# Patient Record
Sex: Male | Born: 1985 | Race: Black or African American | Hispanic: No | Marital: Single | Smoking: Never smoker
Health system: Southern US, Community
[De-identification: ages and names within clinical notes are randomized; demographics above are authoritative.]

## PROBLEM LIST (undated history)

## (undated) DIAGNOSIS — G8929 Other chronic pain: Secondary | ICD-10-CM

## (undated) DIAGNOSIS — M1990 Unspecified osteoarthritis, unspecified site: Secondary | ICD-10-CM

## (undated) HISTORY — PX: ARTHROSCOPIC REPAIR ACL: SUR80

## (undated) HISTORY — PX: MENISCUS REPAIR: SHX5179

## (undated) HISTORY — DX: Other chronic pain: G89.29

---
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
## 2018-10-21 ENCOUNTER — Ambulatory Visit (HOSPITAL_COMMUNITY)
Admission: EM | Admit: 2018-10-21 | Discharge: 2018-10-21 | Disposition: A | Discharge status: Home / Self Care | Payer: BC Managed Care – PPO | Attending: Emergency Medicine | Admitting: Emergency Medicine

## 2018-10-21 ENCOUNTER — Other Ambulatory Visit: Payer: Self-pay

## 2018-10-21 ENCOUNTER — Encounter (HOSPITAL_COMMUNITY): Payer: Self-pay

## 2018-10-21 DIAGNOSIS — B356 Tinea cruris: Secondary | ICD-10-CM

## 2018-10-21 LAB — POCT URINALYSIS DIP (DEVICE)
Bilirubin Urine: NEGATIVE
Glucose, UA: NEGATIVE mg/dL
Ketones, ur: NEGATIVE mg/dL
Leukocytes,Ua: NEGATIVE
Nitrite: NEGATIVE
Protein, ur: NEGATIVE mg/dL
Specific Gravity, Urine: >=1.03 (ref 1.005–1.030)
Urobilinogen, UA: 0.2 mg/dL (ref 0.0–1.0)
pH: 6 (ref 5.0–8.0)

## 2018-10-21 MED ORDER — CLOTRIMAZOLE 1 % EX CREA: TOPICAL_CREAM | CUTANEOUS | 0 refills | Status: DC

## 2018-10-21 NOTE — Discharge Instructions (Signed)
Use the anti-fungal cream twice a day.    Return here or follow-up with your primary care provider if your rash is not improving.

## 2018-10-21 NOTE — ED Provider Notes (Signed)
Brandon Hickman    CSN: [id number] Arrival date & time: 10/21/18  1000      History   Chief Complaint Chief Complaint  Patient presents with  . Exposure to STD    HPI Brandon Hickman is a 33 y.o. male.   Patient presents with a "ringworm" rash on his right scrotum and groin.  He states it itches.  He denies penile discharge, testicular pain, abdominal pain, dysuria, back pain, or other symptoms.  The history is provided by the patient.    History reviewed. No pertinent past medical history.  There are no active problems to display for this patient.   History reviewed. No pertinent surgical history.     Home Medications    Prior to Admission medications   Medication Sig Start Date End Date Taking? Authorizing Provider  clotrimazole (LOTRIMIN) 1 % cream Apply to affected area 2 times daily 10/21/18   Sharion Balloon, NP    Family History History reviewed. No pertinent family history.  Social History Social History   Tobacco Use  . Smoking status: Never Smoker  . Smokeless tobacco: Never Used  Substance Use Topics  . Alcohol use: Never    Frequency: Never  . Drug use: Yes    Types: Marijuana     Allergies   Patient has no known allergies.   Review of Systems Review of Systems  Constitutional: Negative for chills and fever.  HENT: Negative for ear pain and sore throat.   Eyes: Negative for pain and visual disturbance.  Respiratory: Negative for cough and shortness of breath.   Cardiovascular: Negative for chest pain and palpitations.  Gastrointestinal: Negative for abdominal pain and vomiting.  Genitourinary: Negative for discharge, dysuria, flank pain, hematuria, penile pain and testicular pain.  Musculoskeletal: Negative for arthralgias and back pain.  Skin: Positive for rash. Negative for color change.  Neurological: Negative for seizures and syncope.  All other systems reviewed and are negative.    Physical Exam Triage Vital Signs  ED Triage Vitals  Enc Vitals Group     BP      Pulse      Resp      Temp      Temp src      SpO2      Weight      Height      Head Circumference      Peak Flow      Pain Score      Pain Loc      Pain Edu?      Excl. in [place]?    No data found.  Updated Vital Signs BP 132/90 (BP Location: Right Arm)   Pulse 71   Temp 99 F (37.2 C) (Oral)   Resp 16   SpO2 97%   Visual Acuity Right Eye Distance:   Left Eye Distance:   Bilateral Distance:    Right Eye Near:   Left Eye Near:    Bilateral Near:     Physical Exam Vitals signs and nursing note reviewed.  Constitutional:      Appearance: He is well-developed.  HENT:     Head: Normocephalic and atraumatic.  Eyes:     Conjunctiva/sclera: Conjunctivae normal.  Neck:     Musculoskeletal: Neck supple.  Cardiovascular:     Rate and Rhythm: Normal rate and regular rhythm.     Heart sounds: No murmur.  Pulmonary:     Effort: Pulmonary effort is normal. No respiratory distress.  Breath sounds: Normal breath sounds.  Abdominal:     Palpations: Abdomen is soft.     Tenderness: There is no abdominal tenderness. There is no right CVA tenderness, left CVA tenderness, guarding or rebound.  Genitourinary:    Penis: Normal.      Scrotum/Testes: Normal.  Skin:    General: Skin is warm and dry.     Findings: Rash present.     Comments: Candidal rash on right scrotum/groin. No drainage.  Neurological:     General: No focal deficit present.     Mental Status: He is alert and oriented to person, place, and time.      UC Treatments / Results  Labs (all labs ordered are listed, but only abnormal results are displayed) Labs Reviewed  POCT URINALYSIS DIP (DEVICE) - Abnormal; Notable for the following components:      Result Value   Hgb urine dipstick MODERATE (*)    All other components within normal limits    EKG   Radiology No results found.  Procedures Procedures (including critical care time)  Medications  Ordered in UC Medications - No data to display  Initial Impression / Assessment and Plan / UC Course  I have reviewed the triage vital signs and the nursing notes.  Pertinent labs & imaging results that were available during my care of the patient were reviewed by me and considered in my medical decision making (see chart for details).    Tinea cruris.  Treating with clotrimazole cream.  Instructed patient to return here or follow-up with his PCP if his symptoms or not improving.  Patient agrees to plan of care.     Final Clinical Impressions(s) / UC Diagnoses   Final diagnoses:  Tinea cruris     Discharge Instructions     Use the anti-fungal cream twice a day.    Return here or follow-up with your primary care provider if your rash is not improving.          ED Prescriptions    Medication Sig Dispense Auth. Provider   clotrimazole (LOTRIMIN) 1 % cream Apply to affected area 2 times daily 15 g Mickie Bail, NP     PDMP not reviewed this encounter.   Mickie Bail, NP 10/21/18 1029

## 2018-10-21 NOTE — ED Notes (Signed)
Drinking water. Patient is unable to void at this time

## 2018-10-21 NOTE — ED Triage Notes (Signed)
Pt reports itchy in his testicles x 2 weeks.

## 2020-08-14 ENCOUNTER — Encounter (HOSPITAL_COMMUNITY): Payer: Self-pay | Admitting: Emergency Medicine

## 2020-08-14 ENCOUNTER — Emergency Department (HOSPITAL_COMMUNITY)
Admission: EM | Admit: 2020-08-14 | Discharge: 2020-08-15 | Disposition: A | Discharge status: Home / Self Care | Payer: BC Managed Care – PPO | Attending: Emergency Medicine | Admitting: Emergency Medicine

## 2020-08-14 ENCOUNTER — Emergency Department (HOSPITAL_COMMUNITY): Payer: BC Managed Care – PPO

## 2020-08-14 ENCOUNTER — Other Ambulatory Visit: Payer: Self-pay

## 2020-08-14 DIAGNOSIS — R109 Unspecified abdominal pain: Secondary | ICD-10-CM | POA: Insufficient documentation

## 2020-08-14 DIAGNOSIS — M545 Low back pain, unspecified: Secondary | ICD-10-CM | POA: Diagnosis not present

## 2020-08-14 LAB — CBC WITH DIFFERENTIAL/PLATELET
Abs Immature Granulocytes: 0.05 10*3/uL (ref 0.00–0.07)
Basophils Absolute: 0 10*3/uL (ref 0.0–0.1)
Basophils Relative: 0 %
Eosinophils Absolute: 0.2 10*3/uL (ref 0.0–0.5)
Eosinophils Relative: 2 %
HCT: 48 % (ref 39.0–52.0)
Hemoglobin: 16 g/dL (ref 13.0–17.0)
Immature Granulocytes: 0 %
Lymphocytes Relative: 26 %
Lymphs Abs: 3.1 10*3/uL (ref 0.7–4.0)
MCH: 28.3 pg (ref 26.0–34.0)
MCHC: 33.3 g/dL (ref 30.0–36.0)
MCV: 84.8 fL (ref 80.0–100.0)
Monocytes Absolute: 1 10*3/uL (ref 0.1–1.0)
Monocytes Relative: 8 %
Neutro Abs: 7.4 10*3/uL (ref 1.7–7.7)
Neutrophils Relative %: 64 %
Platelets: 215 10*3/uL (ref 150–400)
RBC: 5.66 MIL/uL (ref 4.22–5.81)
RDW: 13.3 % (ref 11.5–15.5)
WBC: 11.8 10*3/uL — ABNORMAL HIGH (ref 4.0–10.5)
nRBC: 0 % (ref 0.0–0.2)

## 2020-08-14 LAB — BASIC METABOLIC PANEL
Anion gap: 11 (ref 5–15)
BUN: 18 mg/dL (ref 6–20)
CO2: 23 mmol/L (ref 22–32)
Calcium: 9.5 mg/dL (ref 8.9–10.3)
Chloride: 105 mmol/L (ref 98–111)
Creatinine, Ser: 1.08 mg/dL (ref 0.61–1.24)
GFR, Estimated: >60 mL/min (ref >60)
Glucose, Bld: 103 mg/dL — ABNORMAL HIGH (ref 70–99)
Potassium: 4 mmol/L (ref 3.5–5.1)
Sodium: 139 mmol/L (ref 135–145)

## 2020-08-14 NOTE — ED Provider Notes (Signed)
Emergency Medicine Provider Triage Evaluation Note  Brandon Hickman , a 35 y.o. male  was evaluated in triage.  Pt complains of bilateral flank pain worse on the left than the right that woke from sleep last night now worsening.  10/10, pain rating on the left leg, worse with movement of the left leg.  History of kidney stones, decreased urine output.  Review of Systems  Positive: Flank pain, low back pain, left leg pain, decreased urine output Negative: Fevers, chills, nausea, vomiting  Physical Exam  BP 131/81 (BP Location: Right Arm)   Pulse 74   Temp 98.4 F (36.9 C) (Oral)   Resp 20   SpO2 100%  Gen:   Awake, obviously uncomfortable Resp:  Tachypneic MSK:   Moves extremities without difficulty, low back pain with movement of the left leg Other:  RRR no M/R/G.  Lungs CTA B.  Tenderness palpation of the back bilaterally, L>R  Medical Decision Making  Medically screening exam initiated at 9:30 PM.  Appropriate orders placed.  Brandon Hickman was informed that the remainder of the evaluation will be completed by another provider, this initial triage assessment does not replace that evaluation, and the importance of remaining in the ED until their evaluation is complete.  This chart was dictated using voice recognition software, Dragon. Despite the best efforts of this provider to proofread and correct errors, errors may still occur which can change documentation meaning.    Sherrilee Gilles 08/14/20 2132    Dione Booze, MD 08/15/20 [phone]

## 2020-08-14 NOTE — ED Triage Notes (Addendum)
Pt reports bilateral flank pain (worse on left side) which worsens with movement. States he thinks its his kidneys from drinking too much soda. Denies injury and changes in urine. Hx of kidney stones.

## 2020-08-15 LAB — URINALYSIS, ROUTINE W REFLEX MICROSCOPIC
Bacteria, UA: NONE SEEN
Bilirubin Urine: NEGATIVE
Glucose, UA: NEGATIVE mg/dL
Ketones, ur: 20 mg/dL — AB
Leukocytes,Ua: NEGATIVE
Nitrite: NEGATIVE
Protein, ur: NEGATIVE mg/dL
Specific Gravity, Urine: 1.031 — ABNORMAL HIGH (ref 1.005–1.030)
pH: 5 (ref 5.0–8.0)

## 2020-08-15 MED ORDER — IBUPROFEN 800 MG PO TABS
800.0000 mg | ORAL_TABLET | Freq: Once | ORAL | Status: AC
  Administered 2020-08-15: 800 mg via ORAL
  Filled 2020-08-15: qty 1

## 2020-08-15 MED ORDER — ACETAMINOPHEN 325 MG PO TABS
650.0000 mg | ORAL_TABLET | Freq: Once | ORAL | Status: AC
  Administered 2020-08-15: 650 mg via ORAL
  Filled 2020-08-15: qty 2

## 2020-08-15 MED ORDER — CYCLOBENZAPRINE HCL 10 MG PO TABS: 10.0000 mg | ORAL_TABLET | Freq: Three times a day (TID) | ORAL | 0 refills | Status: DC | PRN

## 2020-08-15 MED ORDER — CYCLOBENZAPRINE HCL 10 MG PO TABS
10.0000 mg | ORAL_TABLET | Freq: Once | ORAL | Status: AC
  Administered 2020-08-15: 10 mg via ORAL
  Filled 2020-08-15: qty 1

## 2020-08-15 NOTE — ED Provider Notes (Signed)
[facility]-EMERGENCY DEPT Provider Note   CSN: [id number] Arrival date & time: 08/14/20  2106     History Chief Complaint  Patient presents with   Flank Pain    Brandon Hickman is a 35 y.o. male.  The history is provided by the patient.  Flank Pain He complains of pain in the left lower back which started yesterday.  Pain is nonradiating.  He had taken ibuprofen which gave some slight, temporary relief.  Pain started after he got home from work.  It got worse when he was at work today.  Work does involve moderate amount of lifting.  He denies any weakness, numbness, tingling.  It is worse with movement.  Pain was as severe as 10/10.  At work, he was unable to walk without assistance because of the pain.   History reviewed. No pertinent past medical history.  There are no problems to display for this patient.   History reviewed. No pertinent surgical history.     No family history on file.  Social History   Tobacco Use   Smoking status: Never   Smokeless tobacco: Never  Substance Use Topics   Alcohol use: Never   Drug use: Yes    Types: Marijuana    Home Medications Prior to Admission medications   Medication Sig Start Date End Date Taking? Authorizing Provider  clotrimazole (LOTRIMIN) 1 % cream Apply to affected area 2 times daily 10/21/18   Mickie Bail, NP    Allergies    Patient has no known allergies.  Review of Systems   Review of Systems  Genitourinary:  Positive for flank pain.  All other systems reviewed and are negative.  Physical Exam Updated Vital Signs BP (!) 148/100   Pulse 74   Temp 98.4 F (36.9 C) (Oral)   Resp 20   SpO2 100%   Physical Exam Vitals and nursing note reviewed.  35 year old male, resting comfortably and in no acute distress. Vital signs are significant for elevated blood pressure. Oxygen saturation is 100%, which is normal. Head is normocephalic and atraumatic. PERRLA, EOMI. Oropharynx is  clear. Neck is nontender and supple without adenopathy or JVD. Back is nontender in the midline.  There is mild tenderness in the left paralumbar area with mild to moderate left paralumbar spasm.  Straight leg raise is positive on the left at 5 degrees, crossed straight leg raise is positive on the right at 30 degrees.  There is no CVA tenderness. Lungs are clear without rales, wheezes, or rhonchi. Chest is nontender. Heart has regular rate and rhythm without murmur. Abdomen is soft, flat, nontender without masses or hepatosplenomegaly and peristalsis is normoactive. Extremities have no cyanosis or edema, full range of motion is present. Skin is warm and dry without rash. Neurologic: Mental status is normal, cranial nerves are intact, there are no motor or sensory deficits.  ED Results / Procedures / Treatments   Labs (all labs ordered are listed, but only abnormal results are displayed) Labs Reviewed  BASIC METABOLIC PANEL - Abnormal; Notable for the following components:      Result Value   Glucose, Bld 103 (*)    All other components within normal limits  CBC WITH DIFFERENTIAL/PLATELET - Abnormal; Notable for the following components:   WBC 11.8 (*)    All other components within normal limits  URINALYSIS, ROUTINE W REFLEX MICROSCOPIC    Radiology CT L-SPINE NO CHARGE  Result Date: 08/14/2020 CLINICAL DATA:  Left flank  pain EXAM: CT LUMBAR SPINE WITHOUT CONTRAST TECHNIQUE: Multidetector CT imaging of the lumbar spine was performed without intravenous contrast administration. Multiplanar CT image reconstructions were also generated. COMPARISON:  None. FINDINGS: Segmentation: 5 lumbar type vertebrae. Alignment: Normal. Vertebrae: No acute fracture or focal pathologic process. Paraspinal and other soft tissues: Negative. Disc levels: No spinal canal or neural foraminal stenosis. IMPRESSION: No acute fracture or static subluxation of the lumbar spine. Electronically Signed   By: Deatra Robinson M.D.   On: 08/14/2020 22:21   CT Renal Stone Study  Result Date: 08/14/2020 CLINICAL DATA:  Left flank pain. Concern for kidney stone. EXAM: CT ABDOMEN AND PELVIS WITHOUT CONTRAST TECHNIQUE: Multidetector CT imaging of the abdomen and pelvis was performed following the standard protocol without IV contrast. COMPARISON:  Lumbar spine CT dated 08/14/2020. FINDINGS: Evaluation of this exam is limited in the absence of intravenous contrast. Lower chest: The visualized lung bases are clear. No intra-abdominal free air or free fluid. Hepatobiliary: Fatty liver. No intrahepatic biliary ductal dilatation. The gallbladder is unremarkable. Pancreas: Unremarkable. No pancreatic ductal dilatation or surrounding inflammatory changes. Spleen: Normal in size without focal abnormality. Adrenals/Urinary Tract: The adrenal glands are unremarkable. The kidneys, visualized ureters, and the urinary bladder appear unremarkable. Stomach/Bowel: There is no bowel obstruction or active inflammation. The appendix is normal. Vascular/Lymphatic: The abdominal aorta and IVC are grossly unremarkable on this noncontrast CT. No portal venous gas. There is no adenopathy. Reproductive: The prostate and seminal vesicles are grossly unremarkable. No pelvic mass Other: None Musculoskeletal: There is straightening of normal lumbar lordosis. T12 limbus vertebra. No acute osseous pathology. IMPRESSION: 1. No acute intra-abdominal or pelvic pathology. No hydronephrosis or nephrolithiasis. 2. Fatty liver. Electronically Signed   By: Elgie Collard M.D.   On: 08/14/2020 21:59    Procedures Procedures   Medications Ordered in ED Medications  ibuprofen (ADVIL) tablet 800 mg (800 mg Oral Given 08/15/20 0150)  cyclobenzaprine (FLEXERIL) tablet 10 mg (10 mg Oral Given 08/15/20 0150)  acetaminophen (TYLENOL) tablet 650 mg (650 mg Oral Given 08/15/20 0149)    ED Course  I have reviewed the triage vital signs and the nursing notes.  Pertinent  labs & imaging results that were available during my care of the patient were reviewed by me and considered in my medical decision making (see chart for details).   MDM Rules/Calculators/A&P                         Musculoskeletal left-sided low back pain.  Work-up ordered at triage included normal renal stone protocol CT scan, normal CT of lumbar spine, CBC with slightly elevated WBC with normal differential, normal metabolic panel.  Urinalysis had slight ketonuria with 6-10 RBCs per high-power field.  Old records are reviewed, and he has no relevant past visits.  He will be given dose of ibuprofen, acetaminophen, and cyclobenzaprine and reassessed.  He had moderate relief of pain with above-noted treatment.  He is discharged with instructions to apply ice, continue to use over-the-counter NSAIDs and acetaminophen.  Given prescription for cyclobenzaprine.  Final Clinical Impression(s) / ED Diagnoses Final diagnoses:  Low back pain    Rx / DC Orders ED Discharge Orders          Ordered    cyclobenzaprine (FLEXERIL) 10 MG tablet  3 times daily PRN        08/15/20 0256             Dione Booze, MD  08/15/20 0258  

## 2020-08-15 NOTE — Discharge Instructions (Addendum)
Apply ice for 30 minutes at a time, 4 times a day.  Take ibuprofen or naproxen as needed for pain.  For additional pain relief, add acetaminophen.  Combining acetaminophen with either ibuprofen or naproxen gives you better pain relief than either medication by itself.  Take the muscle relaxer 3 times a day, as needed.

## 2021-11-21 ENCOUNTER — Ambulatory Visit: Admission: EM | Admit: 2021-11-21 | Discharge: 2021-11-21 | Payer: BC Managed Care – PPO

## 2021-11-21 ENCOUNTER — Other Ambulatory Visit: Payer: Self-pay

## 2021-11-21 ENCOUNTER — Emergency Department (HOSPITAL_COMMUNITY): Payer: BC Managed Care – PPO

## 2021-11-21 ENCOUNTER — Encounter (HOSPITAL_COMMUNITY): Payer: Self-pay

## 2021-11-21 ENCOUNTER — Observation Stay (HOSPITAL_COMMUNITY)
Admission: EM | Admit: 2021-11-21 | Discharge: 2021-11-22 | Disposition: A | Discharge status: Home / Self Care | Payer: BC Managed Care – PPO | Attending: Internal Medicine | Admitting: Internal Medicine

## 2021-11-21 DIAGNOSIS — R1084 Generalized abdominal pain: Secondary | ICD-10-CM | POA: Diagnosis not present

## 2021-11-21 DIAGNOSIS — E669 Obesity, unspecified: Secondary | ICD-10-CM | POA: Diagnosis not present

## 2021-11-21 DIAGNOSIS — Z1152 Encounter for screening for COVID-19: Secondary | ICD-10-CM | POA: Insufficient documentation

## 2021-11-21 DIAGNOSIS — R112 Nausea with vomiting, unspecified: Secondary | ICD-10-CM | POA: Insufficient documentation

## 2021-11-21 DIAGNOSIS — Z6832 Body mass index (BMI) 32.0-32.9, adult: Secondary | ICD-10-CM | POA: Insufficient documentation

## 2021-11-21 DIAGNOSIS — R1011 Right upper quadrant pain: Secondary | ICD-10-CM | POA: Diagnosis not present

## 2021-11-21 DIAGNOSIS — R109 Unspecified abdominal pain: Principal | ICD-10-CM | POA: Insufficient documentation

## 2021-11-21 DIAGNOSIS — Z79899 Other long term (current) drug therapy: Secondary | ICD-10-CM | POA: Diagnosis not present

## 2021-11-21 DIAGNOSIS — R6510 Systemic inflammatory response syndrome (SIRS) of non-infectious origin without acute organ dysfunction: Secondary | ICD-10-CM | POA: Insufficient documentation

## 2021-11-21 DIAGNOSIS — T68XXXA Hypothermia, initial encounter: Secondary | ICD-10-CM | POA: Diagnosis not present

## 2021-11-21 DIAGNOSIS — R079 Chest pain, unspecified: Secondary | ICD-10-CM | POA: Diagnosis not present

## 2021-11-21 DIAGNOSIS — E872 Acidosis, unspecified: Secondary | ICD-10-CM | POA: Insufficient documentation

## 2021-11-21 DIAGNOSIS — R0689 Other abnormalities of breathing: Secondary | ICD-10-CM | POA: Diagnosis not present

## 2021-11-21 DIAGNOSIS — F12188 Cannabis abuse with other cannabis-induced disorder: Secondary | ICD-10-CM | POA: Diagnosis not present

## 2021-11-21 DIAGNOSIS — N2889 Other specified disorders of kidney and ureter: Secondary | ICD-10-CM | POA: Diagnosis not present

## 2021-11-21 DIAGNOSIS — K760 Fatty (change of) liver, not elsewhere classified: Secondary | ICD-10-CM | POA: Diagnosis not present

## 2021-11-21 DIAGNOSIS — R1013 Epigastric pain: Secondary | ICD-10-CM | POA: Diagnosis not present

## 2021-11-21 DIAGNOSIS — R9431 Abnormal electrocardiogram [ECG] [EKG]: Secondary | ICD-10-CM | POA: Diagnosis not present

## 2021-11-21 DIAGNOSIS — I959 Hypotension, unspecified: Secondary | ICD-10-CM | POA: Diagnosis not present

## 2021-11-21 LAB — COMPREHENSIVE METABOLIC PANEL
ALT: 48 U/L — ABNORMAL HIGH (ref 0–44)
AST: 31 U/L (ref 15–41)
Albumin: 4.6 g/dL (ref 3.5–5.0)
Alkaline Phosphatase: 52 U/L (ref 38–126)
Anion gap: 15 (ref 5–15)
BUN: 10 mg/dL (ref 6–20)
CO2: 18 mmol/L — ABNORMAL LOW (ref 22–32)
Calcium: 9.6 mg/dL (ref 8.9–10.3)
Chloride: 108 mmol/L (ref 98–111)
Creatinine, Ser: 0.96 mg/dL (ref 0.61–1.24)
GFR, Estimated: >60 mL/min (ref >60)
Glucose, Bld: 141 mg/dL — ABNORMAL HIGH (ref 70–99)
Potassium: 4.2 mmol/L (ref 3.5–5.1)
Sodium: 141 mmol/L (ref 135–145)
Total Bilirubin: 1 mg/dL (ref 0.3–1.2)
Total Protein: 7.6 g/dL (ref 6.5–8.1)

## 2021-11-21 LAB — CBC WITH DIFFERENTIAL/PLATELET
Abs Immature Granulocytes: 0.12 10*3/uL — ABNORMAL HIGH (ref 0.00–0.07)
Basophils Absolute: 0.1 10*3/uL (ref 0.0–0.1)
Basophils Relative: 0 %
Eosinophils Absolute: 0 10*3/uL (ref 0.0–0.5)
Eosinophils Relative: 0 %
HCT: 50.4 % (ref 39.0–52.0)
Hemoglobin: 16.3 g/dL (ref 13.0–17.0)
Immature Granulocytes: 1 %
Lymphocytes Relative: 9 %
Lymphs Abs: 1.7 10*3/uL (ref 0.7–4.0)
MCH: 27.6 pg (ref 26.0–34.0)
MCHC: 32.3 g/dL (ref 30.0–36.0)
MCV: 85.3 fL (ref 80.0–100.0)
Monocytes Absolute: 0.7 10*3/uL (ref 0.1–1.0)
Monocytes Relative: 4 %
Neutro Abs: 17.2 10*3/uL — ABNORMAL HIGH (ref 1.7–7.7)
Neutrophils Relative %: 86 %
Platelets: 249 10*3/uL (ref 150–400)
RBC: 5.91 MIL/uL — ABNORMAL HIGH (ref 4.22–5.81)
RDW: 13.1 % (ref 11.5–15.5)
WBC: 19.9 10*3/uL — ABNORMAL HIGH (ref 4.0–10.5)
nRBC: 0 % (ref 0.0–0.2)

## 2021-11-21 LAB — URINALYSIS, ROUTINE W REFLEX MICROSCOPIC
Bacteria, UA: NONE SEEN
Bilirubin Urine: NEGATIVE
Glucose, UA: NEGATIVE mg/dL
Ketones, ur: NEGATIVE mg/dL
Leukocytes,Ua: NEGATIVE
Nitrite: NEGATIVE
Protein, ur: NEGATIVE mg/dL
Specific Gravity, Urine: 1.036 — ABNORMAL HIGH (ref 1.005–1.030)
pH: 6 (ref 5.0–8.0)

## 2021-11-21 LAB — TROPONIN I (HIGH SENSITIVITY)
Troponin I (High Sensitivity): 4 ng/L (ref <18)
Troponin I (High Sensitivity): 4 ng/L (ref <18)

## 2021-11-21 LAB — RESP PANEL BY RT-PCR (FLU A&B, COVID) ARPGX2
Influenza A by PCR: NEGATIVE
Influenza B by PCR: NEGATIVE
SARS Coronavirus 2 by RT PCR: NEGATIVE

## 2021-11-21 LAB — PROTIME-INR
INR: 1.1 (ref 0.8–1.2)
Prothrombin Time: 13.7 seconds (ref 11.4–15.2)

## 2021-11-21 LAB — APTT: aPTT: 31 seconds (ref 24–36)

## 2021-11-21 LAB — LACTIC ACID, PLASMA
Lactic Acid, Venous: 4.1 mmol/L (ref 0.5–1.9)
Lactic Acid, Venous: 4.2 mmol/L (ref 0.5–1.9)

## 2021-11-21 LAB — LIPASE, BLOOD: Lipase: 27 U/L (ref 11–51)

## 2021-11-21 MED ORDER — IOHEXOL 350 MG/ML SOLN
75.0000 mL | Freq: Once | INTRAVENOUS | Status: AC | PRN
  Administered 2021-11-21: 75 mL via INTRAVENOUS

## 2021-11-21 MED ORDER — ENOXAPARIN SODIUM 40 MG/0.4ML IJ SOSY: 40.0000 mg | PREFILLED_SYRINGE | INTRAMUSCULAR | Status: DC

## 2021-11-21 MED ORDER — DROPERIDOL 2.5 MG/ML IJ SOLN
2.5000 mg | Freq: Once | INTRAMUSCULAR | Status: AC
  Administered 2021-11-21: 2.5 mg via INTRAVENOUS
  Filled 2021-11-21: qty 2

## 2021-11-21 MED ORDER — ONDANSETRON HCL 4 MG/2ML IJ SOLN
4.0000 mg | Freq: Once | INTRAMUSCULAR | Status: AC
  Administered 2021-11-21: 4 mg via INTRAVENOUS
  Filled 2021-11-21: qty 2

## 2021-11-21 MED ORDER — SODIUM CHLORIDE 0.9 % IV SOLN
2.0000 g | Freq: Once | INTRAVENOUS | Status: DC
  Filled 2021-11-21: qty 20

## 2021-11-21 MED ORDER — LACTATED RINGERS IV BOLUS (SEPSIS): 1000.0000 mL | Freq: Once | INTRAVENOUS | Status: DC

## 2021-11-21 MED ORDER — MORPHINE SULFATE (PF) 4 MG/ML IV SOLN
4.0000 mg | Freq: Once | INTRAVENOUS | Status: AC
  Administered 2021-11-21: 4 mg via INTRAVENOUS
  Filled 2021-11-21: qty 1

## 2021-11-21 MED ORDER — LACTATED RINGERS IV BOLUS (SEPSIS): 500.0000 mL | Freq: Once | INTRAVENOUS | Status: DC

## 2021-11-21 MED ORDER — MORPHINE SULFATE (PF) 2 MG/ML IV SOLN
1.0000 mg | INTRAVENOUS | Status: DC | PRN
  Administered 2021-11-22: 1 mg via INTRAVENOUS
  Filled 2021-11-21: qty 1

## 2021-11-21 MED ORDER — METRONIDAZOLE 500 MG/100ML IV SOLN
500.0000 mg | Freq: Once | INTRAVENOUS | Status: DC
  Filled 2021-11-21: qty 100

## 2021-11-21 MED ORDER — LACTATED RINGERS IV BOLUS (SEPSIS)
1000.0000 mL | Freq: Once | INTRAVENOUS | Status: AC
  Administered 2021-11-21: 1000 mL via INTRAVENOUS

## 2021-11-21 MED ORDER — SODIUM CHLORIDE 0.9 % IV SOLN: INTRAVENOUS | Status: DC

## 2021-11-21 MED ORDER — ACETAMINOPHEN 325 MG PO TABS: 650.0000 mg | ORAL_TABLET | Freq: Four times a day (QID) | ORAL | Status: DC | PRN

## 2021-11-21 MED ORDER — LACTATED RINGERS IV BOLUS
1000.0000 mL | Freq: Once | INTRAVENOUS | Status: AC
  Administered 2021-11-21: 1000 mL via INTRAVENOUS

## 2021-11-21 MED ORDER — LIDOCAINE VISCOUS HCL 2 % MT SOLN
15.0000 mL | Freq: Once | OROMUCOSAL | Status: AC
  Administered 2021-11-21: 15 mL via ORAL
  Filled 2021-11-21: qty 15

## 2021-11-21 MED ORDER — ONDANSETRON HCL 4 MG/2ML IJ SOLN
4.0000 mg | Freq: Four times a day (QID) | INTRAMUSCULAR | Status: DC | PRN
  Administered 2021-11-22: 4 mg via INTRAVENOUS
  Filled 2021-11-21: qty 2

## 2021-11-21 MED ORDER — LACTATED RINGERS IV SOLN: INTRAVENOUS | Status: DC

## 2021-11-21 MED ORDER — ACETAMINOPHEN 650 MG RE SUPP: 650.0000 mg | Freq: Four times a day (QID) | RECTAL | Status: DC | PRN

## 2021-11-21 MED ORDER — ALUM & MAG HYDROXIDE-SIMETH 200-200-20 MG/5ML PO SUSP
30.0000 mL | Freq: Once | ORAL | Status: AC
  Administered 2021-11-21: 30 mL via ORAL
  Filled 2021-11-21: qty 30

## 2021-11-21 NOTE — ED Notes (Signed)
Patient is being discharged from the Urgent Care and sent to the Emergency Department via [facility] . Per Provdider Langston Masker, patient is in need of higher level of care due to abdominal pain, sob, weakness, and diarrhea. Patient is aware and verbalizes understanding of plan of care. There were no vitals filed for this visit.

## 2021-11-21 NOTE — ED Triage Notes (Signed)
Pt presents with sudden weakness, LUQ abdominal pain, vomiting, diarrhea, and shortness of breath since early this morning.

## 2021-11-21 NOTE — ED Notes (Signed)
Pt states that his pain is located in one spot and nothing he does makes it better tor worse

## 2021-11-21 NOTE — ED Provider Notes (Signed)
Procedures  Procedures   Medical Decision Making Amount and/or Complexity of Data Reviewed Labs: ordered. Decision-making details documented in ED Course. Radiology: ordered. ECG/medicine tests: ordered.  Risk OTC drugs. Prescription drug management. Decision regarding hospitalization.    Assumption of Care   I assumed care of [name] on 11/21/2021 at 3 PM from [place], [place].   Briefly, Brandon Hickman is a 36 y.o. male who presented for abdominal pain, N/V as below. The signout from the previous ED provider included: Clinical Course as of 11/21/21 2221  Sat Nov 21, 2021  1300 RBC(!): 5.91 [AA]  1411 NEUT#(!): 17.2 [AA]  1411 WBC(!): 19.9 [AA]  1500 P/w severe abd pain starting at 3 am, N/V with continued dry heaving. Initially epigastric, now more periumbilical. UC sent him here. Initially diaphoretic. Near syncopal in CT. Leukocytosis, unremarkable abdominal CT. No CP or SOB, no PNA on CXR. Lipase, trop WNL. Ordered for sepsis fluids, rocephin and flagyl. Pending RUQ [place], UA. Will likely need admit for intractable pain.  [DG]  2113 Spoke with Dr Loney Loh for admission. [RP]    Clinical Course User Index [AA] Marita [place], PA-C [DG] Stephanie Coup, MD [RP] Rondel Baton, MD     Please refer to the original provider's note for additional information regarding the care of Brandon Hickman.  Reassessment: Vital Signs:  The most current vitals were  Vitals:   11/21/21 1802 11/21/21 2030  BP: (!) 119/98 128/85  Pulse: (!) 104 87  Resp: 16 20  Temp: 97.9 F (36.6 C)   SpO2: 97% 95%    Physical Exam:  Hemodynamics:  The patient is hemodynamically stable. Mental Status:  The patient is alert. PE: No acute distress, normal respirations.  Abdomen soft, diffusely mildly tender to palpation.   Additional MDM: On my initial examination, patient overall well-appearing.  No peritonitis on exam, abdomen soft and minimally tender diffusely.  Labs  demonstrate leukocytosis, however patient afebrile here, hemodynamically stable, with otherwise unremarkable labs and no significant abnormalities on CT abdomen pelvis.  On further questioning, patient reports that he does smoke marijuana and last smoked yesterday night.  In light of overall reassuring labs and imaging, my suspicion at this time is highest for cannabinoid hyperemesis syndrome.  We will treat with morphine, droperidol, IV fluids, and reassess.  Do not feel that patient requires antibiotics for sepsis at this time, feel the elevated lactic and leukocytosis, CO2 of 18 are secondary to stress reaction in the setting of recurrent vomiting.  Will recheck lactic after IV fluids, and if improving as well as symptomatic improvement, will plan to discharge with counseling on marijuana cessation.  If intractable pain and inability to p.o., patient may require admission.  I personally reassessed the patient at 4:40 PM after administration of morphine, droperidol, and IV fluids.  He states that he feels significantly improved at this time, is able to rest comfortably in bed.  He feels ready to try p.o. intake at this time.  Will repeat lactic acid, if appropriately downtrending after pain medication and fluids and patient able to tolerate p.o., feel patient will be safe for discharge.  I discussed this with the patient and family member at bedside, who agreed with this plan.  Right upper quadrant ultrasound unremarkable.  Lactic acid repeated, unfortunately remains elevated at 4.2 even after IV fluids.  Patient more uncomfortable again on exam now, feel that in light of his rising lactate and persistent pain, patient would benefit from admission for  further IV fluids, pain control, and trending of lactic acid.  Patient admitted to hospitalist for further management.  Diagnosis: 1. Cannabinoid hyperemesis syndrome   2. Generalized abdominal pain                Stephanie Coup, MD 11/21/21  2221    Rondel Baton, MD 11/22/21 1256

## 2021-11-21 NOTE — ED Provider Notes (Signed)
[facility] EMERGENCY DEPARTMENT Provider Note   CSN: [id number] Arrival date & time: 11/21/21  1102     History  Chief Complaint  Patient presents with   Abdominal Pain    Brandon Hickman is a 36 y.o. male.  35 year old male presents today for evaluation of severe epigastric abdominal pain.  He states pain is nonradiating.  Pain woke patient up around 3 AM.  From 3 AM to 5 AM patient had recurrent episodes of emesis following which she had dry heaving.  He did have another episode of emesis prior to arrival.  Denies fever, chest pain, diarrhea, constipation, or hematemesis.  Patient appears quite uncomfortable on exam.  Diaphoresis noted.  Patient prior to arriving into his room within the emergency room he was taken to CT scan.  When he got up from wheelchair, he had a near syncopal episode.  He did not pass out and the staff was able to catch him onto the wheelchair.  Did not fall to the ground.  No head injury.  He denies significant alcohol use.  Does endorse social drinking.  Denies prior abdominal surgeries.  He is not on any routine medications.  The history is provided by the patient. No language interpreter was used.       Home Medications Prior to Admission medications   Medication Sig Start Date End Date Taking? Authorizing Provider  clotrimazole (LOTRIMIN) 1 % cream Apply to affected area 2 times daily 10/21/18   Sharion Balloon, NP  cyclobenzaprine (FLEXERIL) 10 MG tablet Take 1 tablet (10 mg total) by mouth 3 (three) times daily as needed for muscle spasms. [id number]   Delora Fuel, MD      Allergies    Patient has no known allergies.    Review of Systems   Review of Systems  Constitutional:  Negative for chills and fever.  Gastrointestinal:  Positive for abdominal pain, nausea and vomiting. Negative for blood in stool, constipation and diarrhea.  Neurological:  Positive for light-headedness. Negative for syncope.  All other systems reviewed and  are negative.   Physical Exam Updated Vital Signs BP (!) 142/93   Pulse 74   Temp (!) 97.3 F (36.3 C) (Oral)   Resp (!) 28   Ht 6\' 2"  (1.88 m)   Wt 115.7 kg   SpO2 100%   BMI 32.74 kg/m  Physical Exam Vitals and nursing note reviewed.  Constitutional:      General: He is not in acute distress.    Appearance: Normal appearance. He is not ill-appearing.  HENT:     Head: Normocephalic and atraumatic.     Nose: Nose normal.  Eyes:     General: No scleral icterus.    Extraocular Movements: Extraocular movements intact.     Conjunctiva/sclera: Conjunctivae normal.  Cardiovascular:     Rate and Rhythm: Normal rate and regular rhythm.     Pulses: Normal pulses.  Pulmonary:     Effort: Pulmonary effort is normal. No respiratory distress.     Breath sounds: Normal breath sounds. No wheezing or rales.  Abdominal:     General: There is no distension.     Palpations: Abdomen is soft.     Tenderness: There is no abdominal tenderness. There is no right CVA tenderness, left CVA tenderness or guarding.  Musculoskeletal:        General: Normal range of motion.     Cervical back: Normal range of motion.  Skin:    General: Skin  is warm and dry.  Neurological:     General: No focal deficit present.     Mental Status: He is alert. Mental status is at baseline.     ED Results / Procedures / Treatments   Labs (all labs ordered are listed, but only abnormal results are displayed) Labs Reviewed  CBC WITH DIFFERENTIAL/PLATELET - Abnormal; Notable for the following components:      Result Value   WBC 19.9 (*)    RBC 5.91 (*)    Neutro Abs 17.2 (*)    Abs Immature Granulocytes 0.12 (*)    All other components within normal limits  COMPREHENSIVE METABOLIC PANEL - Abnormal; Notable for the following components:   CO2 18 (*)    Glucose, Bld 141 (*)    ALT 48 (*)    All other components within normal limits  RESP PANEL BY RT-PCR (FLU A&B, COVID) ARPGX2  CULTURE, BLOOD (ROUTINE X 2)   CULTURE, BLOOD (ROUTINE X 2)  URINE CULTURE  LIPASE, BLOOD  LACTIC ACID, PLASMA  LACTIC ACID, PLASMA  PROTIME-INR  APTT  URINALYSIS, ROUTINE W REFLEX MICROSCOPIC  TROPONIN I (HIGH SENSITIVITY)  TROPONIN I (HIGH SENSITIVITY)    EKG None  Radiology No results found.  Procedures Procedures    Medications Ordered in ED Medications  lactated ringers infusion (has no administration in time range)  lactated ringers bolus 1,000 mL (has no administration in time range)    And  lactated ringers bolus 1,000 mL (has no administration in time range)    And  lactated ringers bolus 1,000 mL (has no administration in time range)    And  lactated ringers bolus 500 mL (has no administration in time range)  cefTRIAXone (ROCEPHIN) 2 g in sodium chloride 0.9 % 100 mL IVPB (has no administration in time range)  metroNIDAZOLE (FLAGYL) IVPB 500 mg (has no administration in time range)  morphine (PF) 4 MG/ML injection 4 mg (has no administration in time range)  ondansetron (ZOFRAN) injection 4 mg (4 mg Intravenous Given 11/21/21 1152)    ED Course/ Medical Decision Making/ A&P Clinical Course as of 11/21/21 1511  Sat Nov 21, 2021  1300 RBC(!): 5.91 [AA]  1411 NEUT#(!): 17.2 [AA]  1411 WBC(!): 19.9 [AA]  1500 P/w severe abd pain starting at 3 am, N/V with continued dry heaving. Initially epigastric, now more periumbilical. UC sent him here. Initially diaphoretic. Near syncopal in CT. Leukocytosis, unremarkable abdominal CT. No CP or SOB, no PNA on CXR. Lipase, trop WNL. Ordered for sepsis fluids, rocephin and flagyl. Pending RUQ [place], UA. Will likely need admit for intractable pain.  [DG]    Clinical Course User Index [AA] Marita Kansas, PA-C [DG] Stephanie Coup, MD                           Medical Decision Making Amount and/or Complexity of Data Reviewed Labs: ordered. Decision-making details documented in ED Course. Radiology: ordered. ECG/medicine tests: ordered.  Risk OTC  drugs. Prescription drug management.   Medical Decision Making / ED Course   This patient presents to the ED for concern of abdominal pain, this involves an extensive number of treatment options, and is a complaint that carries with it a high risk of complications and morbidity.  The differential diagnosis includes pancreatitis, cholecystitis, gastroenteritis  MDM: 36 year old male presents today for evaluation of abdominal pain.  This is nonradiating.  Associated with nausea and vomiting.  Was diaphoretic initially with your  syncopal episode in radiology.  Diffuse abdominal tenderness with guarding.  He is afebrile.  Without tachycardia.  Social drinking.  Work-up shows leukocytosis of 19.9 with somewhat of a left shift.  CMP shows preserved renal function normal electrolytes.  Glucose of 141.  Given the leukocytosis, significant abdominal tenderness, and tachypnea noted initially sepsis protocol was ordered with volume resuscitation, antibiotics.  Pain control was also ordered.  Lipase within normal limits. At the time out patient was awaiting his right upper quadrant ultrasound, administration of his fluids, pain medication, antibiotics.  Patient was evaluated by oncoming team and was found to smoke marijuana most recently last night.  Most likely cause of patient's symptoms is likely Hyperemesis Syndrome.  Agree with Discontinuing Antibiotics and Treated with Droperidol, with morphine, and volume resuscitation.  Disposition per oncoming team.  Lab Tests: -I ordered, reviewed, and interpreted labs.   The pertinent results include:   Labs Reviewed  CBC WITH DIFFERENTIAL/PLATELET - Abnormal; Notable for the following components:      Result Value   WBC 19.9 (*)    RBC 5.91 (*)    Neutro Abs 17.2 (*)    Abs Immature Granulocytes 0.12 (*)    All other components within normal limits  COMPREHENSIVE METABOLIC PANEL - Abnormal; Notable for the following components:   CO2 18 (*)    Glucose, Bld  141 (*)    ALT 48 (*)    All other components within normal limits  LACTIC ACID, PLASMA - Abnormal; Notable for the following components:   Lactic Acid, Venous 4.1 (*)    All other components within normal limits  RESP PANEL BY RT-PCR (FLU A&B, COVID) ARPGX2  CULTURE, BLOOD (ROUTINE X 2)  CULTURE, BLOOD (ROUTINE X 2)  URINE CULTURE  LIPASE, BLOOD  PROTIME-INR  APTT  LACTIC ACID, PLASMA  URINALYSIS, ROUTINE W REFLEX MICROSCOPIC  TROPONIN I (HIGH SENSITIVITY)  TROPONIN I (HIGH SENSITIVITY)      EKG  EKG Interpretation  Date/Time:    Ventricular Rate:    PR Interval:    QRS Duration:   QT Interval:    QTC Calculation:   R Axis:     Text Interpretation:           Imaging Studies ordered: I ordered imaging studies including CT abdomen pelvis, right upper quadrant ultrasound, chest x-ray I independently visualized and interpreted imaging. I agree with the radiologist interpretation   Medicines ordered and prescription drug management: Meds ordered this encounter  Medications   ondansetron (ZOFRAN) injection 4 mg   DISCONTD: lactated ringers infusion   lactated ringers bolus 1,000 mL    Order Specific Question:   Total Body Weight basis for 30 mL/kg  bolus delivery    Answer:   115.7 kg   DISCONTD: lactated ringers bolus 1,000 mL    Order Specific Question:   Total Body Weight basis for 30 mL/kg  bolus delivery    Answer:   115.7 kg   DISCONTD: lactated ringers bolus 1,000 mL    Order Specific Question:   Total Body Weight basis for 30 mL/kg  bolus delivery    Answer:   115.7 kg   DISCONTD: lactated ringers bolus 500 mL    Order Specific Question:   Total Body Weight basis for 30 mL/kg  bolus delivery    Answer:   115.7 kg   DISCONTD: cefTRIAXone (ROCEPHIN) 2 g in sodium chloride 0.9 % 100 mL IVPB    Order Specific Question:   Antibiotic Indication:  Answer:   Intra-abdominal   DISCONTD: metroNIDAZOLE (FLAGYL) IVPB 500 mg    Order Specific Question:    Antibiotic Indication:    Answer:   Intra-abdominal Infection   morphine (PF) 4 MG/ML injection 4 mg   iohexol (OMNIPAQUE) 350 MG/ML injection 75 mL   AND Linked Order Group    alum & mag hydroxide-simeth (MAALOX/MYLANTA) 200-200-20 MG/5ML suspension 30 mL    lidocaine (XYLOCAINE) 2 % viscous mouth solution 15 mL   droperidol (INAPSINE) 2.5 MG/ML injection 2.5 mg    -I have reviewed the patients home medicines and have made adjustments as needed    Reevaluation: After the interventions noted above, I reevaluated the patient and found that they have :stayed the same  Co morbidities that complicate the patient evaluation History reviewed. No pertinent past medical history.    Dispostion: Patient signed out to oncoming team at the end of my shift.  They will need to be reevaluated and dispo appropriately.  Final Clinical Impression(s) / ED Diagnoses Final diagnoses:  None    Rx / DC Orders ED Discharge Orders     None         Evlyn Courier, PA-C 11/21/21 1518    Regan Lemming, MD 11/22/21 1207

## 2021-11-21 NOTE — H&P (Signed)
History and Physical    Brandon Hickman [id number] DOB: [birth year] DOA: 11/21/2021  PCP: Patient, No Pcp Per  Patient coming from: Home  Chief Complaint: Abdominal pain  HPI: Brandon Hickman is a 36 y.o. male with past medical history of marijuana abuse presented to the ED for evaluation of abdominal pain, nausea, vomiting, and shortness of breath.  Afebrile.  Slightly tachycardic.  Blood pressure stable.  Not hypoxic.  Labs showing WBC 19.9, hemoglobin 16.3, bicarb 18, anion gap 15, glucose 141, creatinine 0.9, ALT borderline elevated at 48 with normal remainder of LFTs, lipase normal, troponin negative x2, lactic acid 4.1 > 4.2, UA not suggestive of infection, blood cultures drawn, COVID and flu negative.  Chest x-ray showing no active disease per radiologist interpretation (imaging not available at this time for personal review).  CT abdomen pelvis and right upper quadrant abdominal ultrasound negative for acute finding; both studies showing findings consistent with hepatic steatosis. Patient was given GI cocktail, droperidol, morphine, Zofran, and 2 L LR boluses.  Patient states he woke up at 3 AM this morning with severe generalized abdominal pain.  Since then he has had multiple episodes of nonbloody, nonbilious emesis at home.  His last bowel movement was today.  He denies any diarrhea.  Endorsing daily marijuana use, last used yesterday.  Denies history of any other medical conditions.  Denies fevers or chest pain.  He was having shortness of breath earlier when he had abdominal pain but symptoms have now resolved.  He was able to eat crackers and peanut butter in the ED.  He is requesting more food.  Review of Systems:  Review of Systems  All other systems reviewed and are negative.   History reviewed. No pertinent past medical history.  History reviewed. No pertinent surgical history.   reports that he has never smoked. He has never used smokeless tobacco. He reports current  drug use. Drug: Marijuana. He reports that he does not drink alcohol.  No Known Allergies  History reviewed. No pertinent family history.  Prior to Admission medications   Not on File    Physical Exam: Vitals:   11/21/21 1440 11/21/21 1611 11/21/21 1802 11/21/21 2030  BP:  (!) 141/92 (!) 119/98 128/85  Pulse: 64 74 (!) 104 87  Resp: 14 16 16 20   Temp:  98.3 F (36.8 C) 97.9 F (36.6 C)   TempSrc:  Oral Oral   SpO2: 99% 99% 97% 95%  Weight:      Height:        Physical Exam Vitals reviewed.  Constitutional:      General: He is not in acute distress. HENT:     Head: Normocephalic and atraumatic.  Eyes:     Extraocular Movements: Extraocular movements intact.  Cardiovascular:     Rate and Rhythm: Normal rate and regular rhythm.     Pulses: Normal pulses.  Pulmonary:     Effort: Pulmonary effort is normal. No respiratory distress.     Breath sounds: Normal breath sounds. No wheezing or rales.  Abdominal:     General: Bowel sounds are normal. There is no distension.     Palpations: Abdomen is soft.     Tenderness: There is no abdominal tenderness. There is no guarding or rebound.  Musculoskeletal:        General: No swelling or tenderness.     Cervical back: Normal range of motion.  Skin:    General: Skin is warm and dry.  Neurological:  General: No focal deficit present.     Mental Status: He is alert and oriented to person, place, and time.     Labs on Admission: I have personally reviewed following labs and imaging studies  CBC: Recent Labs  Lab 11/21/21 1128  WBC 19.9*  NEUTROABS 17.2*  HGB 16.3  HCT 50.4  MCV 85.3  PLT 0000000   Basic Metabolic Panel: Recent Labs  Lab 11/21/21 1128  NA 141  K 4.2  CL 108  CO2 18*  GLUCOSE 141*  BUN 10  CREATININE 0.96  CALCIUM 9.6   GFR: Estimated Creatinine Clearance: 146.6 mL/min (by C-G formula based on SCr of 0.96 mg/dL). Liver Function Tests: Recent Labs  Lab 11/21/21 1128  AST 31  ALT 48*   ALKPHOS 52  BILITOT 1.0  PROT 7.6  ALBUMIN 4.6   Recent Labs  Lab 11/21/21 1128  LIPASE 27   No results for input(s): "AMMONIA" in the last 168 hours. Coagulation Profile: Recent Labs  Lab 11/21/21 1412  INR 1.1   Cardiac Enzymes: No results for input(s): "CKTOTAL", "CKMB", "CKMBINDEX", "TROPONINI" in the last 168 hours. BNP (last 3 results) No results for input(s): "PROBNP" in the last 8760 hours. HbA1C: No results for input(s): "HGBA1C" in the last 72 hours. CBG: No results for input(s): "GLUCAP" in the last 168 hours. Lipid Profile: No results for input(s): "CHOL", "HDL", "LDLCALC", "TRIG", "CHOLHDL", "LDLDIRECT" in the last 72 hours. Thyroid Function Tests: No results for input(s): "TSH", "T4TOTAL", "FREET4", "T3FREE", "THYROIDAB" in the last 72 hours. Anemia Panel: No results for input(s): "VITAMINB12", "FOLATE", "FERRITIN", "TIBC", "IRON", "RETICCTPCT" in the last 72 hours. Urine analysis:    Component Value Date/Time   COLORURINE YELLOW 11/21/2021 1806   APPEARANCEUR CLEAR 11/21/2021 1806   LABSPEC 1.036 (H) 11/21/2021 1806   PHURINE 6.0 11/21/2021 1806   GLUCOSEU NEGATIVE 11/21/2021 1806   HGBUR MODERATE (A) 11/21/2021 1806   BILIRUBINUR NEGATIVE 11/21/2021 1806   KETONESUR NEGATIVE 11/21/2021 1806   PROTEINUR NEGATIVE 11/21/2021 1806   UROBILINOGEN 0.2 10/21/2018 1018   NITRITE NEGATIVE 11/21/2021 1806   LEUKOCYTESUR NEGATIVE 11/21/2021 1806    Radiological Exams on Admission: US Abdomen Limited RUQ (LIVER/GB)  Result Date: 11/21/2021 CLINICAL DATA:  Right upper quadrant abdominal pain. EXAM: ULTRASOUND ABDOMEN LIMITED RIGHT UPPER QUADRANT COMPARISON:  Abdomen and pelvis CT obtained earlier today. FINDINGS: Gallbladder: No gallstones or wall thickening visualized. No sonographic Murphy sign noted by sonographer. Common bile duct: Diameter: 0.5 mm Liver: Diffusely echogenic. Corresponding low density on the CT. Portal vein is patent on color Doppler  imaging with normal direction of blood flow towards the liver. Other: None. IMPRESSION: 1. No acute abnormality. 2. Diffuse hepatic steatosis. Electronically Signed   By: Claudie Revering M.D.   On: 11/21/2021 15:02   DG Chest 1 View  Result Date: 11/21/2021 CLINICAL DATA:  Abdominal pain EXAM: CHEST  1 VIEW COMPARISON:  None Available. FINDINGS: The heart size and mediastinal contours are within normal limits. Both lungs are clear. The visualized skeletal structures are unremarkable. IMPRESSION: No active disease. Electronically Signed   By: Elmer Picker M.D.   On: 11/21/2021 13:31   CT ABDOMEN PELVIS W CONTRAST  Result Date: 11/21/2021 CLINICAL DATA:  Left upper quadrant abdominal pain, vomiting and diarrhea. Short of breath. Weakness. EXAM: CT ABDOMEN AND PELVIS WITH CONTRAST TECHNIQUE: Multidetector CT imaging of the abdomen and pelvis was performed using the standard protocol following bolus administration of intravenous contrast. RADIATION DOSE REDUCTION: This exam was performed  according to the departmental dose-optimization program which includes automated exposure control, adjustment of the mA and/or kV according to patient size and/or use of iterative reconstruction technique. CONTRAST:  56mL OMNIPAQUE IOHEXOL 350 MG/ML SOLN COMPARISON:  08/14/2020. FINDINGS: Lower chest: Clear lung bases. Hepatobiliary: Liver normal in size. Decreased liver attenuation consistent with fatty infiltration. No liver mass. Normal gallbladder. No bile duct dilation. Pancreas: Unremarkable. No pancreatic ductal dilatation or surrounding inflammatory changes. Spleen: Normal in size without focal abnormality. Adrenals/Urinary Tract: Normal adrenal glands. Kidneys normal in size, orientation and position. 1 cm low-attenuation mass in the mid to upper pole the right kidney consistent with a cyst. No follow-up recommended. No other renal masses, no stones and no hydronephrosis. Normal ureters. Normal bladder.  Stomach/Bowel: Stomach is within normal limits. Appendix appears normal. No evidence of bowel wall thickening, distention, or inflammatory changes. Vascular/Lymphatic: No significant vascular findings are present. No enlarged abdominal or pelvic lymph nodes. Reproductive: Unremarkable. Other: No abdominal wall hernia or abnormality. No abdominopelvic ascites. Musculoskeletal: No fracture or acute finding.  No bone lesion. IMPRESSION: 1. No acute findings within the abdomen or pelvis. No findings to account for the patient's pain, vomiting or diarrhea. 2. Decreased liver attenuation consistent with hepatic steatosis. Electronically Signed   By: Amie Portland M.D.   On: 11/21/2021 13:18    EKG: Independently reviewed.  Initial EKG showing sinus rhythm with PVCs.  Repeat EKGs showing sinus rhythm.  No prior tracing for comparison.  Assessment and Plan  Generalized abdominal pain, nausea, vomiting Symptoms likely related to marijuana use.  Troponin negative x2.  ALT borderline elevated at 48 with normal remainder of LFTs, lipase normal. COVID and flu negative.  UA not suggestive of infection.  CT abdomen pelvis and right upper quadrant abdominal ultrasound negative for acute finding; both studies showing findings consistent with hepatic steatosis.  Abdominal exam currently benign.  Symptoms have now resolved and he was able to tolerate eating crackers and peanut butter in the ED.  Currently requesting more food.  -Continue symptomatic management: IV fluid hydration, antiemetic as needed, pain management  SIRS/ ?sepsis Mildly tachycardic.  WBC 19.9.  Lactic acid 4.1 > 4.2 after 1 L fluid bolus.  No infectious source identified on work-up including UA and imaging of chest/abdomen.  No signs of meningitis or skin infection.  No other obvious source of infection at this time.  Leukocytosis could possibly be reactive and dehydration could be causing lactic acidosis and mild tachycardia.  He is afebrile and not  hypotensive. -Patient received additional 1 L bolus in the ED after second lactic acid was drawn.  Will continue IV fluid hydration and trend lactate. -Check procalcitonin -Monitor WBC count  Metabolic acidosis Likely due to lactic acidosis.  Bicarb 18. -IV fluid hydration and monitor labs closely  DVT prophylaxis: Lovenox Code Status: Full Code Family Communication: Patient's mother, stepmother, and sister at bedside. Level of care: Med-Surg Admission status: It is my clinical opinion that referral for OBSERVATION is reasonable and necessary in this patient based on the above information provided. The aforementioned taken together are felt to place the patient at high risk for further clinical deterioration. However, it is anticipated that the patient may be medically stable for discharge from the hospital within 24 to 48 hours.   John Giovanni MD [facility]  If 7PM-7AM, please contact night-coverage [email or web address]  11/21/2021, 9:14 PM

## 2021-11-21 NOTE — ED Triage Notes (Signed)
Pt BIB [facility] from urgent care c/o abdominal pain in the upper middle area. Pt states it started at 3am. Pt endorses N/V. Pt had 4mg  of zofran and started a 20g in his left hand.

## 2021-11-21 NOTE — ED Provider Triage Note (Signed)
Emergency Medicine Provider Triage Evaluation Note  Brandon Hickman , a 36 y.o. male  was evaluated in triage.  Pt complains of here for evaluation of feeling unwell.  Epigastric abdominal pain since yesterday.  Multiple episodes of emesis.  Active emesis in room.  No lower extremity swelling.  Pain goes to his back.  No chronic EtOH use.  Has never had anything like this previously.  Review of Systems  Positive: Abd pain, emesis Negative:   Physical Exam  BP (!) 142/93   Pulse 74   Temp (!) 97.3 F (36.3 C) (Oral)   Resp (!) 28   Ht 6\' 2"  (1.88 m)   Wt 115.7 kg   SpO2 100%   BMI 32.74 kg/m  Gen:   Awake, emesis Resp:  Normal effort  MSK:   Moves extremities without difficulty  Abd:  Diffusely tender epigastric region Other:    Medical Decision Making  Medically screening exam initiated at 11:19 AM.  Appropriate orders placed.  Brandon Hickman was informed that the remainder of the evaluation will be completed by another provider, this initial triage assessment does not replace that evaluation, and the importance of remaining in the ED until their evaluation is complete.  Abd pain, emesis   Kathleen Tamm A, PA-C 11/21/21 1121

## 2021-11-21 NOTE — ED Provider Notes (Addendum)
[name]    CSN: [id number] Arrival date & time: 11/21/21  1007      History   Chief Complaint Chief Complaint  Patient presents with   Abdominal Pain   Emesis   Diarrhea   Shortness of Breath    r    HPI Brandon Hickman is a 36 y.o. male.   Pt complains of severe abdominal pain and difficulty breathing.  Pt reports he has had vomiting.  Pt complains of feeling week.  Pt reports difficulty standing and walking    The history is provided by the patient. No language interpreter was used.  Abdominal Pain Pain location:  Generalized Pain radiates to:  Does not radiate Pain severity:  Severe Onset quality:  Sudden Timing:  Constant Chronicity:  New Relieved by:  Nothing Worsened by:  Nothing Ineffective treatments:  None tried Associated symptoms: diarrhea, shortness of breath and vomiting   Emesis Associated symptoms: abdominal pain and diarrhea   Diarrhea Associated symptoms: abdominal pain and vomiting   Shortness of Breath Associated symptoms: abdominal pain and vomiting     No past medical history on file.  There are no problems to display for this patient.   No past surgical history on file.     Home Medications    Prior to Admission medications   Medication Sig Start Date End Date Taking? Authorizing Provider  clotrimazole (LOTRIMIN) 1 % cream Apply to affected area 2 times daily 10/21/18   Sharion Balloon, NP  cyclobenzaprine (FLEXERIL) 10 MG tablet Take 1 tablet (10 mg total) by mouth 3 (three) times daily as needed for muscle spasms. [id number]   Delora Fuel, MD    Family History No family history on file.  Social History Social History   Tobacco Use   Smoking status: Never   Smokeless tobacco: Never  Substance Use Topics   Alcohol use: Never   Drug use: Yes    Types: Marijuana     Allergies   Patient has no known allergies.   Review of Systems Review of Systems  Respiratory:  Positive for shortness of breath.    Gastrointestinal:  Positive for abdominal pain, diarrhea and vomiting.  All other systems reviewed and are negative.    Physical Exam Triage Vital Signs ED Triage Vitals  Enc Vitals Group     BP      Pulse      Resp      Temp      Temp src      SpO2      Weight      Height      Head Circumference      Peak Flow      Pain Score      Pain Loc      Pain Edu?      Excl. in [place]?    No data found.  Updated Vital Signs There were no vitals taken for this visit.  Visual Acuity Right Eye Distance:   Left Eye Distance:   Bilateral Distance:    Right Eye Near:   Left Eye Near:    Bilateral Near:     Physical Exam Vitals and nursing note reviewed.  Constitutional:      Appearance: He is diaphoretic.  HENT:     Head: Normocephalic.  Cardiovascular:     Rate and Rhythm: Normal rate.  Pulmonary:     Effort: Pulmonary effort is normal.  Abdominal:     General: Bowel sounds are  increased. There is no distension.     Palpations: Abdomen is soft.     Tenderness: There is generalized abdominal tenderness.  Musculoskeletal:        General: Normal range of motion.     Cervical back: Normal range of motion.  Skin:    General: Skin is warm.  Neurological:     Mental Status: He is alert and oriented to person, place, and time.  Psychiatric:        Mood and Affect: Mood normal.      UC Treatments / Results  Labs (all labs ordered are listed, but only abnormal results are displayed) Labs Reviewed - No data to display  EKG   Radiology No results found.  Procedures Procedures (including critical care time)  Medications Ordered in UC Medications - No data to display  Initial Impression / Assessment and Plan / UC Course  I have reviewed the triage vital signs and the nursing notes.  Pertinent labs & imaging results that were available during my care of the patient were reviewed by me and considered in my medical decision making (see chart for details).      MDM:  Pt sweating profusely, moaning.  Pt seems very uncomfortable. RN was able to obtain IV x 999.  Pt given zofran odt. I discussed need to go to Hospital.  Pt unable to sit up.  Mother uncomfortable driving pt.  EMS called to transport  Final Clinical Impressions(s) / UC Diagnoses   Final diagnoses:  Generalized abdominal pain   Discharge Instructions   None    ED Prescriptions   None    PDMP not reviewed this encounter. An After Visit Summary was printed and given to the patient.    Elson Areas, PA-C 11/21/21 1030    [address], [place] 11/21/21 1043

## 2021-11-22 DIAGNOSIS — R109 Unspecified abdominal pain: Secondary | ICD-10-CM | POA: Diagnosis not present

## 2021-11-22 DIAGNOSIS — R112 Nausea with vomiting, unspecified: Secondary | ICD-10-CM

## 2021-11-22 DIAGNOSIS — E872 Acidosis, unspecified: Secondary | ICD-10-CM | POA: Diagnosis not present

## 2021-11-22 LAB — URINE CULTURE: Culture: 400 — AB

## 2021-11-22 LAB — HIV ANTIBODY (ROUTINE TESTING W REFLEX): HIV Screen 4th Generation wRfx: NONREACTIVE

## 2021-11-22 LAB — LACTIC ACID, PLASMA: Lactic Acid, Venous: 2 mmol/L (ref 0.5–1.9)

## 2021-11-22 LAB — PROCALCITONIN: Procalcitonin: <0.1 ng/mL

## 2021-11-22 MED ORDER — ACETAMINOPHEN 325 MG PO TABS: 650.0000 mg | ORAL_TABLET | Freq: Four times a day (QID) | ORAL | Status: DC | PRN

## 2021-11-22 NOTE — ED Notes (Signed)
Pt had an episode of nausea and vomiting. PRN zofran and Morphine given.

## 2021-11-22 NOTE — Discharge Summary (Signed)
PATIENT DETAILS Name: Brandon Hickman Age: 36 y.o. Sex: male Date of Birth: March 30, 1985 MRN: [id number]. Admitting Physician: John Giovanni, MD WUJ:[id number], No Pcp Per  Admit Date: 11/21/2021 Discharge date: 11/22/2021  Recommendations for Outpatient Follow-up:  Follow up with PCP in 1-2 weeks Please obtain CMP/CBC in one week Continue counseling regarding importance of avoiding marijuana use. Please follow blood/urine cultures that are currently pending at the time of discharge.  Admitted From:  Home  Disposition: Home   Discharge Condition: good  CODE STATUS:   Code Status: Full Code   Diet recommendation:  Diet Order             Diet general           Diet regular Room service appropriate? Yes; Fluid consistency: Thin  Diet effective now                    Brief Summary: 36 year old male with no significant past medical history-who presented with several episodes of nausea, vomiting/abdominal pain.  He was subsequently admitted to the hospitalist service.  Brief Hospital Course: Nausea/vomiting/abdominal pain Suspicion for cannabis hyperemesis syndrome Managed with supportive care-with complete resolution of symptoms Abdomen benign on exam this morning Tolerating diet Requesting to be discharged home  Note-sepsis ruled out.  Lactic acidosis/metabolic acidosis Due to dehydration No indication of sepsis Has what appears to be reactive leukocytosis-PCP to repeat CBC in 1 week. PCP to follow cultures until negative  Marijuana use Counseled regarding importance of abstinence  Obesity: Estimated body mass index is 32.74 kg/m as calculated from the following:   Height as of this encounter:  (1.88 m).   Weight as of this encounter: 115.7 kg.     Discharge Diagnoses:  Principal Problem:   Abdominal pain Active Problems:   Nausea and vomiting   SIRS (systemic inflammatory response syndrome) (HCC)   Metabolic acidosis   Discharge  Instructions:  Activity:  As tolerated   Discharge Instructions     Call MD for:  persistant nausea and vomiting   Complete by: As directed    Diet general   Complete by: As directed    Discharge instructions   Complete by: As directed    Follow with Primary MD  in 1-2 weeks  Please note-your blood cultures and urine cultures are currently pending at the time of discharge-please ask your primary care practitioner to follow-up these results.  Refrain from further marijuana use.  Please get a complete blood count and chemistry panel checked by your Primary MD at your next visit, and again as instructed by your Primary MD.  Get Medicines reviewed and adjusted: Please take all your medications with you for your next visit with your Primary MD  Laboratory/radiological data: Please request your Primary MD to go over all hospital tests and procedure/radiological results at the follow up, please ask your Primary MD to get all Hospital records sent to his/her office.  In some cases, they will be blood work, cultures and biopsy results pending at the time of your discharge. Please request that your primary care M.D. follows up on these results.  Also Note the following: If you experience worsening of your admission symptoms, develop shortness of breath, life threatening emergency, suicidal or homicidal thoughts you must seek medical attention immediately by calling 911 or calling your MD immediately  if symptoms less severe.  You must read complete instructions/literature along with all the possible adverse reactions/side effects for all the Medicines you take  and that have been prescribed to you. Take any new Medicines after you have completely understood and accpet all the possible adverse reactions/side effects.   Do not drive when taking Pain medications or sleeping medications (Benzodaizepines)  Do not take more than prescribed Pain, Sleep and Anxiety Medications. It is not advisable  to combine anxiety,sleep and pain medications without talking with your primary care practitioner  Special Instructions: If you have smoked or chewed Tobacco  in the last 2 yrs please stop smoking, stop any regular Alcohol  and or any Recreational drug use.  Wear Seat belts while driving.  Please note: You were cared for by a hospitalist during your hospital stay. Once you are discharged, your primary care physician will handle any further medical issues. Please note that NO REFILLS for any discharge medications will be authorized once you are discharged, as it is imperative that you return to your primary care physician (or establish a relationship with a primary care physician if you do not have one) for your post hospital discharge needs so that they can reassess your need for medications and monitor your lab values.   Increase activity slowly   Complete by: As directed       Allergies as of 11/22/2021   No Known Allergies      Medication List     TAKE these medications    acetaminophen 325 MG tablet Commonly known as: TYLENOL Take 2 tablets (650 mg total) by mouth every 6 (six) hours as needed for mild pain (or Fever >/= 101).        Follow-up Information     Primary care practitioner. Schedule an appointment as soon as possible for a visit in 1 week(s).   Why: Hospital follow up               No Known Allergies   Other Procedures/Studies: US Abdomen Limited RUQ (LIVER/GB)  Result Date: 11/21/2021 CLINICAL DATA:  Right upper quadrant abdominal pain. EXAM: ULTRASOUND ABDOMEN LIMITED RIGHT UPPER QUADRANT COMPARISON:  Abdomen and pelvis CT obtained earlier today. FINDINGS: Gallbladder: No gallstones or wall thickening visualized. No sonographic Murphy sign noted by sonographer. Common bile duct: Diameter: 0.5 mm Liver: Diffusely echogenic. Corresponding low density on the CT. Portal vein is patent on color Doppler imaging with normal direction of blood flow towards  the liver. Other: None. IMPRESSION: 1. No acute abnormality. 2. Diffuse hepatic steatosis. Electronically Signed   By: Beckie Salts M.D.   On: 11/21/2021 15:02   DG Chest 1 View  Result Date: 11/21/2021 CLINICAL DATA:  Abdominal pain EXAM: CHEST  1 VIEW COMPARISON:  None Available. FINDINGS: The heart size and mediastinal contours are within normal limits. Both lungs are clear. The visualized skeletal structures are unremarkable. IMPRESSION: No active disease. Electronically Signed   By: Ernie Avena M.D.   On: 11/21/2021 13:31   CT ABDOMEN PELVIS W CONTRAST  Result Date: 11/21/2021 CLINICAL DATA:  Left upper quadrant abdominal pain, vomiting and diarrhea. Short of breath. Weakness. EXAM: CT ABDOMEN AND PELVIS WITH CONTRAST TECHNIQUE: Multidetector CT imaging of the abdomen and pelvis was performed using the standard protocol following bolus administration of intravenous contrast. RADIATION DOSE REDUCTION: This exam was performed according to the departmental dose-optimization program which includes automated exposure control, adjustment of the mA and/or kV according to patient size and/or use of iterative reconstruction technique. CONTRAST:  48mL OMNIPAQUE IOHEXOL 350 MG/ML SOLN COMPARISON:  08/14/2020. FINDINGS: Lower chest: Clear lung bases. Hepatobiliary: Liver normal in  size. Decreased liver attenuation consistent with fatty infiltration. No liver mass. Normal gallbladder. No bile duct dilation. Pancreas: Unremarkable. No pancreatic ductal dilatation or surrounding inflammatory changes. Spleen: Normal in size without focal abnormality. Adrenals/Urinary Tract: Normal adrenal glands. Kidneys normal in size, orientation and position. 1 cm low-attenuation mass in the mid to upper pole the right kidney consistent with a cyst. No follow-up recommended. No other renal masses, no stones and no hydronephrosis. Normal ureters. Normal bladder. Stomach/Bowel: Stomach is within normal limits. Appendix  appears normal. No evidence of bowel wall thickening, distention, or inflammatory changes. Vascular/Lymphatic: No significant vascular findings are present. No enlarged abdominal or pelvic lymph nodes. Reproductive: Unremarkable. Other: No abdominal wall hernia or abnormality. No abdominopelvic ascites. Musculoskeletal: No fracture or acute finding.  No bone lesion. IMPRESSION: 1. No acute findings within the abdomen or pelvis. No findings to account for the patient's pain, vomiting or diarrhea. 2. Decreased liver attenuation consistent with hepatic steatosis. Electronically Signed   By: Amie Portland M.D.   On: 11/21/2021 13:18     TODAY-DAY OF DISCHARGE:  Subjective:   Brandon Hickman today has no headache,no chest abdominal pain,no new weakness tingling or numbness, feels much better wants to go home today.   Objective:   Blood pressure (!) 168/93, pulse 81, temperature 98.3 F (36.8 C), temperature source Oral, resp. rate 15, height 6\' 2"  (1.88 m), weight 115.7 kg, SpO2 96 %.  Intake/Output Summary (Last 24 hours) at 11/22/2021 0733 Last data filed at 11/22/2021 13/12/2021 Gross per 24 hour  Intake 2227.47 ml  Output --  Net 2227.47 ml   Filed Weights   11/21/21 1110  Weight: 115.7 kg    Exam: Awake Alert, Oriented *3, No new F.N deficits, Normal affect [place].AT,PERRAL Supple Neck,No JVD, No cervical lymphadenopathy appriciated.  Symmetrical Chest wall movement, Good air movement bilaterally, CTAB RRR,No Gallops,Rubs or new Murmurs, No Parasternal Heave +ve B.Sounds, Abd Soft, Non tender, No organomegaly appriciated, No rebound -guarding or rigidity. No Cyanosis, Clubbing or edema, No new Rash or bruise   PERTINENT RADIOLOGIC STUDIES: 13/11/23 Abdomen Limited RUQ (LIVER/GB)  Result Date: 11/21/2021 CLINICAL DATA:  Right upper quadrant abdominal pain. EXAM: ULTRASOUND ABDOMEN LIMITED RIGHT UPPER QUADRANT COMPARISON:  Abdomen and pelvis CT obtained earlier today. FINDINGS: Gallbladder: No  gallstones or wall thickening visualized. No sonographic Murphy sign noted by sonographer. Common bile duct: Diameter: 0.5 mm Liver: Diffusely echogenic. Corresponding low density on the CT. Portal vein is patent on color Doppler imaging with normal direction of blood flow towards the liver. Other: None. IMPRESSION: 1. No acute abnormality. 2. Diffuse hepatic steatosis. Electronically Signed   By: 13/11/2021 M.D.   On: 11/21/2021 15:02   DG Chest 1 View  Result Date: 11/21/2021 CLINICAL DATA:  Abdominal pain EXAM: CHEST  1 VIEW COMPARISON:  None Available. FINDINGS: The heart size and mediastinal contours are within normal limits. Both lungs are clear. The visualized skeletal structures are unremarkable. IMPRESSION: No active disease. Electronically Signed   By: 13/11/2021 M.D.   On: 11/21/2021 13:31   CT ABDOMEN PELVIS W CONTRAST  Result Date: 11/21/2021 CLINICAL DATA:  Left upper quadrant abdominal pain, vomiting and diarrhea. Short of breath. Weakness. EXAM: CT ABDOMEN AND PELVIS WITH CONTRAST TECHNIQUE: Multidetector CT imaging of the abdomen and pelvis was performed using the standard protocol following bolus administration of intravenous contrast. RADIATION DOSE REDUCTION: This exam was performed according to the departmental dose-optimization program which includes automated exposure control, adjustment of the mA  and/or kV according to patient size and/or use of iterative reconstruction technique. CONTRAST:  75mL OMNIPAQUE IOHEXOL 350 MG/ML SOLN COMPARISON:  08/14/2020. FINDINGS: Lower chest: Clear lung bases. Hepatobiliary: Liver normal in size. Decreased liver attenuation consistent with fatty infiltration. No liver mass. Normal gallbladder. No bile duct dilation. Pancreas: Unremarkable. No pancreatic ductal dilatation or surrounding inflammatory changes. Spleen: Normal in size without focal abnormality. Adrenals/Urinary Tract: Normal adrenal glands. Kidneys normal in size, orientation  and position. 1 cm low-attenuation mass in the mid to upper pole the right kidney consistent with a cyst. No follow-up recommended. No other renal masses, no stones and no hydronephrosis. Normal ureters. Normal bladder. Stomach/Bowel: Stomach is within normal limits. Appendix appears normal. No evidence of bowel wall thickening, distention, or inflammatory changes. Vascular/Lymphatic: No significant vascular findings are present. No enlarged abdominal or pelvic lymph nodes. Reproductive: Unremarkable. Other: No abdominal wall hernia or abnormality. No abdominopelvic ascites. Musculoskeletal: No fracture or acute finding.  No bone lesion. IMPRESSION: 1. No acute findings within the abdomen or pelvis. No findings to account for the patient's pain, vomiting or diarrhea. 2. Decreased liver attenuation consistent with hepatic steatosis. Electronically Signed   By: Amie Portland M.D.   On: 11/21/2021 13:18     PERTINENT LAB RESULTS: CBC: Recent Labs    11/21/21 1128  WBC 19.9*  HGB 16.3  HCT 50.4  PLT 249   CMET CMP     Component Value Date/Time   NA 141 11/21/2021 1128   K 4.2 11/21/2021 1128   CL 108 11/21/2021 1128   CO2 18 (L) 11/21/2021 1128   GLUCOSE 141 (H) 11/21/2021 1128   BUN 10 11/21/2021 1128   CREATININE 0.96 11/21/2021 1128   CALCIUM 9.6 11/21/2021 1128   PROT 7.6 11/21/2021 1128   ALBUMIN 4.6 11/21/2021 1128   AST 31 11/21/2021 1128   ALT 48 (H) 11/21/2021 1128   ALKPHOS 52 11/21/2021 1128   BILITOT 1.0 11/21/2021 1128   GFRNONAA >60 11/21/2021 1128    GFR Estimated Creatinine Clearance: 146.6 mL/min (by C-G formula based on SCr of 0.96 mg/dL). Recent Labs    11/21/21 1128  LIPASE 27   No results for input(s): "CKTOTAL", "CKMB", "CKMBINDEX", "TROPONINI" in the last 72 hours. Invalid input(s): "POCBNP" No results for input(s): "DDIMER" in the last 72 hours. No results for input(s): "HGBA1C" in the last 72 hours. No results for input(s): "CHOL", "HDL", "LDLCALC",  "TRIG", "CHOLHDL", "LDLDIRECT" in the last 72 hours. No results for input(s): "TSH", "T4TOTAL", "T3FREE", "THYROIDAB" in the last 72 hours.  Invalid input(s): "FREET3" No results for input(s): "VITAMINB12", "FOLATE", "FERRITIN", "TIBC", "IRON", "RETICCTPCT" in the last 72 hours. Coags: Recent Labs    11/21/21 1412  INR 1.1   Microbiology: Recent Results (from the past 240 hour(s))  Resp Panel by RT-PCR (Flu A&B, Covid) Anterior Nasal Swab     Status: None   Collection Time: 11/21/21  6:06 PM   Specimen: Anterior Nasal Swab  Result Value Ref Range Status   SARS Coronavirus 2 by RT PCR NEGATIVE NEGATIVE Final    Comment: (NOTE) SARS-CoV-2 target nucleic acids are NOT DETECTED.  The SARS-CoV-2 RNA is generally detectable in upper respiratory specimens during the acute phase of infection. The lowest concentration of SARS-CoV-2 viral copies this assay can detect is 138 copies/mL. A negative result does not preclude SARS-Cov-2 infection and should not be used as the sole basis for treatment or other patient management decisions. A negative result may occur with  improper  specimen collection/handling, submission of specimen other than nasopharyngeal swab, presence of viral mutation(s) within the areas targeted by this assay, and inadequate number of viral copies(<138 copies/mL). A negative result must be combined with clinical observations, patient history, and epidemiological information. The expected result is Negative.  Fact Sheet for Patients:  BloggerCourse.comhttps://www.fda.gov/media/152166/download  Fact Sheet for Healthcare Providers:  SeriousBroker.ithttps://www.fda.gov/media/152162/download  This test is no t yet approved or cleared by the [place] FDA and  has been authorized for detection and/or diagnosis of SARS-CoV-2 by FDA under an Emergency Use Authorization (EUA). This EUA will remain  in effect (meaning this test can be used) for the duration of the COVID-19 declaration under Section  564(b)(1) of the Act, 21 U.S.C.section 360bbb-3(b)(1), unless the authorization is terminated  or revoked sooner.       Influenza A by PCR NEGATIVE NEGATIVE Final   Influenza B by PCR NEGATIVE NEGATIVE Final    Comment: (NOTE) The Xpert Xpress SARS-CoV-2/FLU/RSV plus assay is intended as an aid in the diagnosis of influenza from Nasopharyngeal swab specimens and should not be used as a sole basis for treatment. Nasal washings and aspirates are unacceptable for Xpert Xpress SARS-CoV-2/FLU/RSV testing.  Fact Sheet for Patients: BloggerCourse.comhttps://www.fda.gov/media/152166/download  Fact Sheet for Healthcare Providers: SeriousBroker.ithttps://www.fda.gov/media/152162/download  This test is not yet approved or cleared by the [place] FDA and has been authorized for detection and/or diagnosis of SARS-CoV-2 by FDA under an Emergency Use Authorization (EUA). This EUA will remain in effect (meaning this test can be used) for the duration of the COVID-19 declaration under Section 564(b)(1) of the Act, 21 U.S.C. section 360bbb-3(b)(1), unless the authorization is terminated or revoked.  Performed at [facility], [address] [place], [place] [address]     FURTHER DISCHARGE INSTRUCTIONS:  Get Medicines reviewed and adjusted: Please take all your medications with you for your next visit with your Primary MD  Laboratory/radiological data: Please request your Primary MD to go over all hospital tests and procedure/radiological results at the follow up, please ask your Primary MD to get all Hospital records sent to his/her office.  In some cases, they will be blood work, cultures and biopsy results pending at the time of your discharge. Please request that your primary care M.D. goes through all the records of your hospital data and follows up on these results.  Also Note the following: If you experience worsening of your admission symptoms, develop shortness of breath, life threatening emergency,  suicidal or homicidal thoughts you must seek medical attention immediately by calling 911 or calling your MD immediately  if symptoms less severe.  You must read complete instructions/literature along with all the possible adverse reactions/side effects for all the Medicines you take and that have been prescribed to you. Take any new Medicines after you have completely understood and accpet all the possible adverse reactions/side effects.   Do not drive when taking Pain medications or sleeping medications (Benzodaizepines)  Do not take more than prescribed Pain, Sleep and Anxiety Medications. It is not advisable to combine anxiety,sleep and pain medications without talking with your primary care practitioner  Special Instructions: If you have smoked or chewed Tobacco  in the last 2 yrs please stop smoking, stop any regular Alcohol  and or any Recreational drug use.  Wear Seat belts while driving.  Please note: You were cared for by a hospitalist during your hospital stay. Once you are discharged, your primary care physician will handle any further medical issues. Please note that  NO REFILLS for any discharge medications will be authorized once you are discharged, as it is imperative that you return to your primary care physician (or establish a relationship with a primary care physician if you do not have one) for your post hospital discharge needs so that they can reassess your need for medications and monitor your lab values.  Total Time spent coordinating discharge including counseling, education and face to face time equals greater than 30 minutes.  SignedJeoffrey Massed 11/22/2021 7:33 AM

## 2021-11-26 LAB — CULTURE, BLOOD (ROUTINE X 2)
Culture: NO GROWTH
Culture: NO GROWTH
Special Requests: ADEQUATE

## 2022-01-07 IMAGING — CT CT L SPINE W/O CM
4 series · 18 of 33 positions shown, 20 images · non-contrast
Comparison: None.

CLINICAL DATA: Left flank pain

EXAM:
CT LUMBAR SPINE WITHOUT CONTRAST
TECHNIQUE: Multidetector CT imaging of the lumbar spine was performed without
intravenous contrast administration. Multiplanar CT image
reconstructions were also generated.

[Series 7: l spine st · axial · 0.37mm/px · z∈[-378,-189]mm · 4 of 107 slices shown, 5 images]
[im 22/107  soft-tissue]
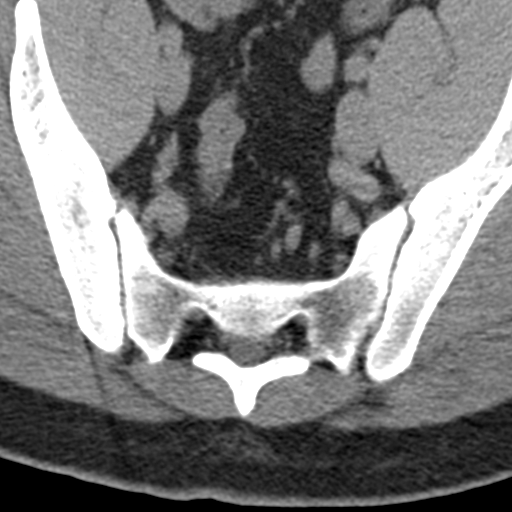
[im 22/107  bone]
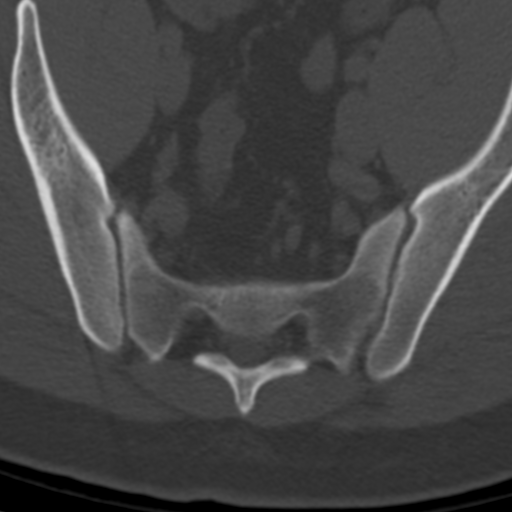
[im 43/107  bone]
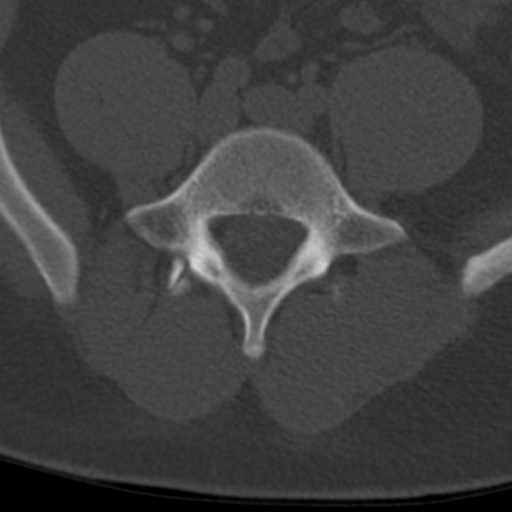
[im 64/107  bone]
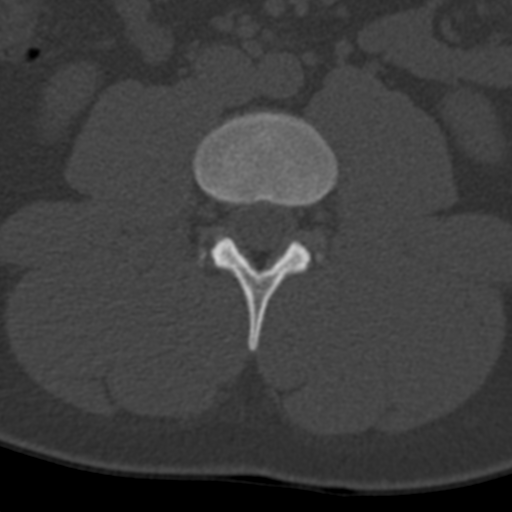
[im 85/107  bone]
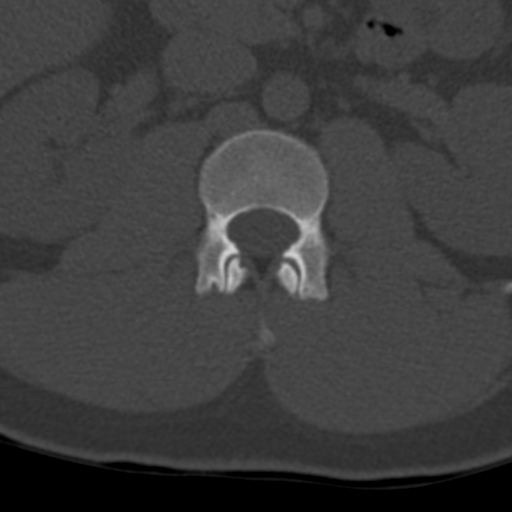

[Series 602: l spine sag · sagittal · 0.62mm/px · 5 of 51 slices shown, 6 images]
[im 17/51  bone]
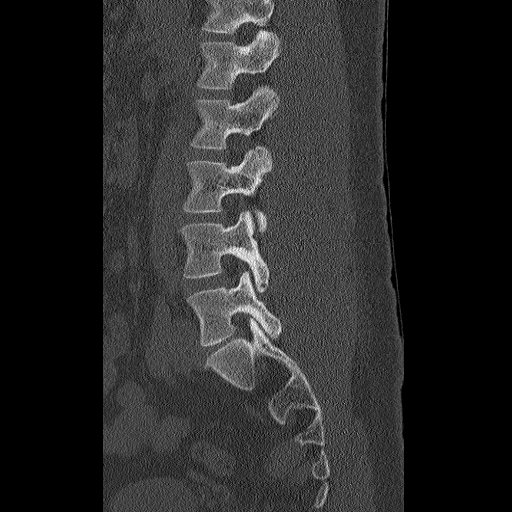
[im 21/51  bone]
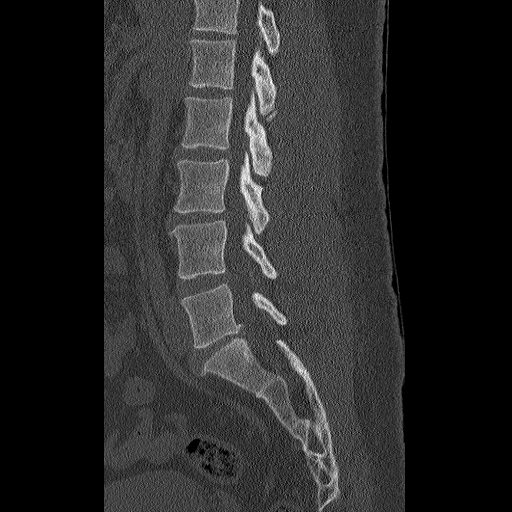
[im 26/51  soft-tissue]
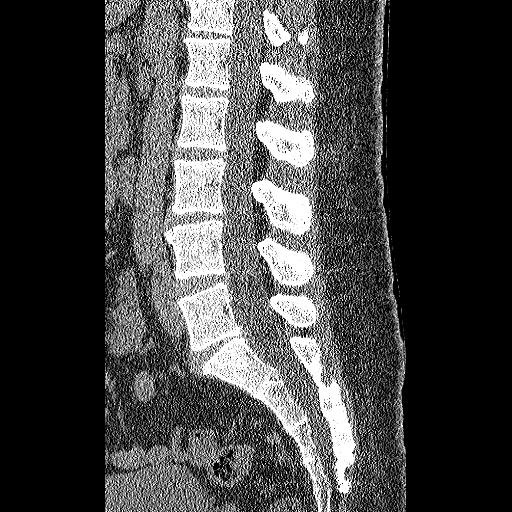
[im 26/51  bone]
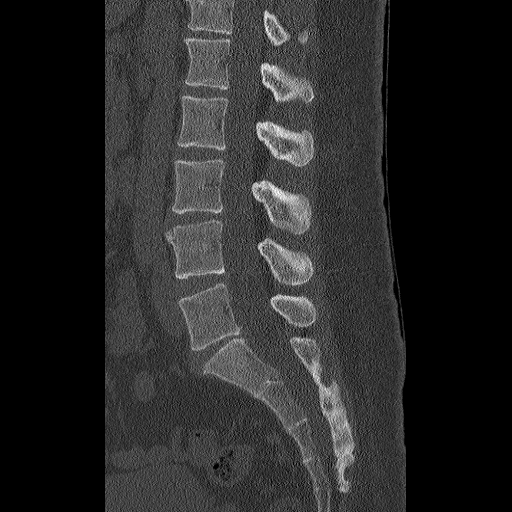
[im 30/51  bone]
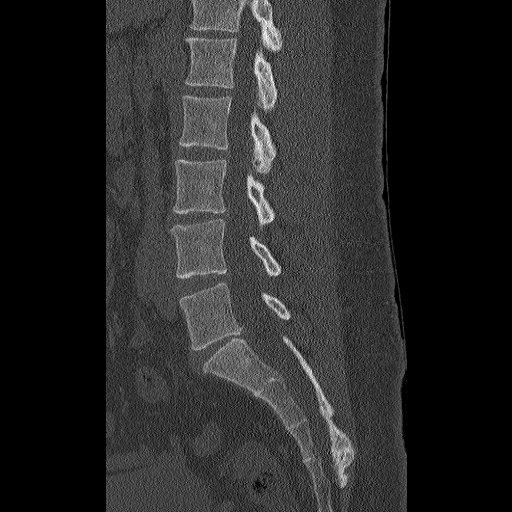
[im 34/51  bone]
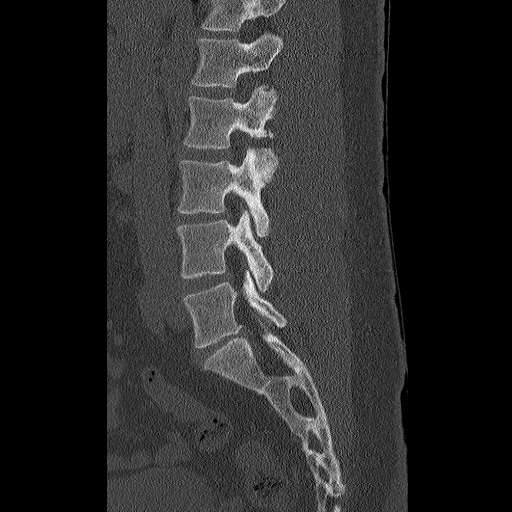

[Series 603: l spine coronals · coronal · 0.62mm/px · 3 of 66 slices shown]
[im 14/66  bone]
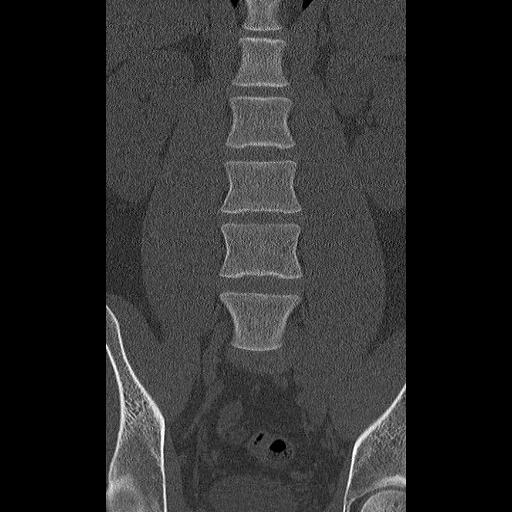
[im 27/66  bone]
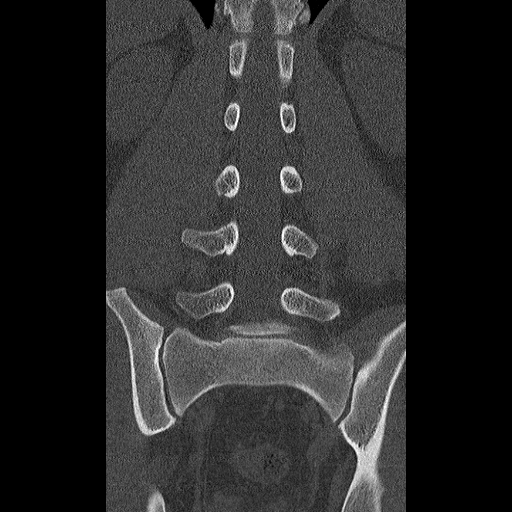
[im 40/66  bone]
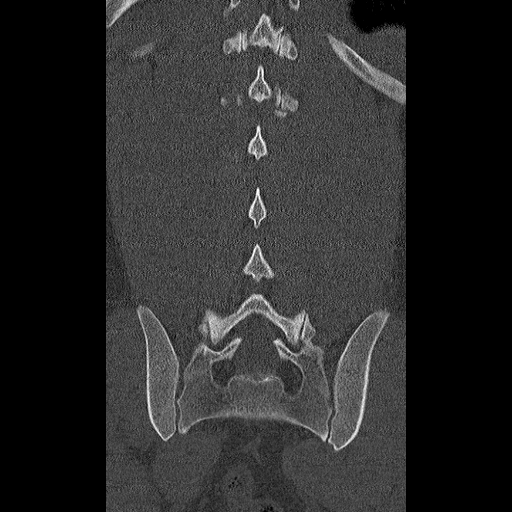

[Series 604: true axials · axial · 0.62mm/px · z∈[-345,-155]mm · 6 of 133 slices shown]
[im 19/133  bone]
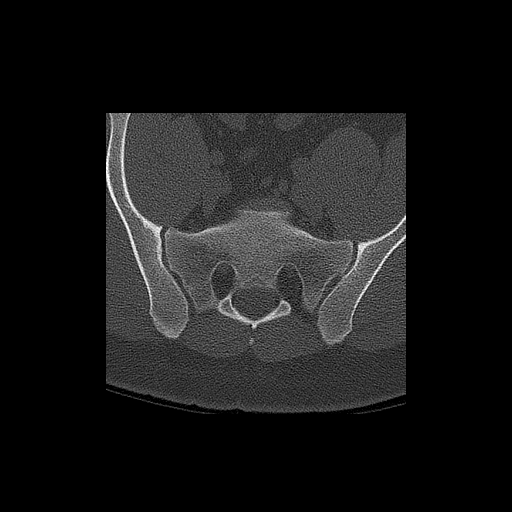
[im 38/133  bone]
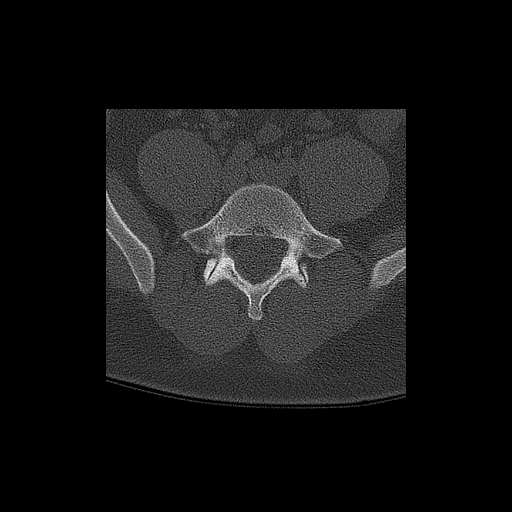
[im 57/133  bone]
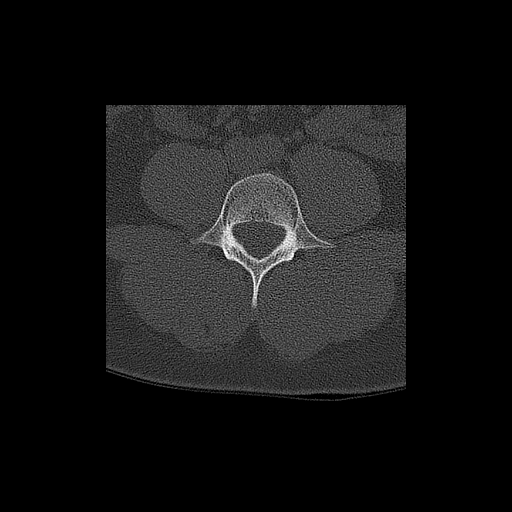
[im 76/133  bone]
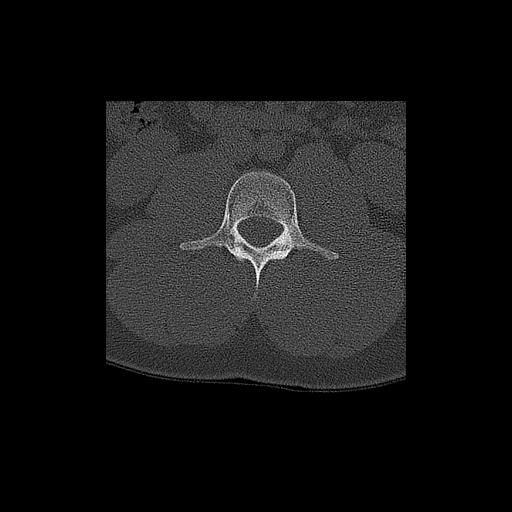
[im 95/133  bone]
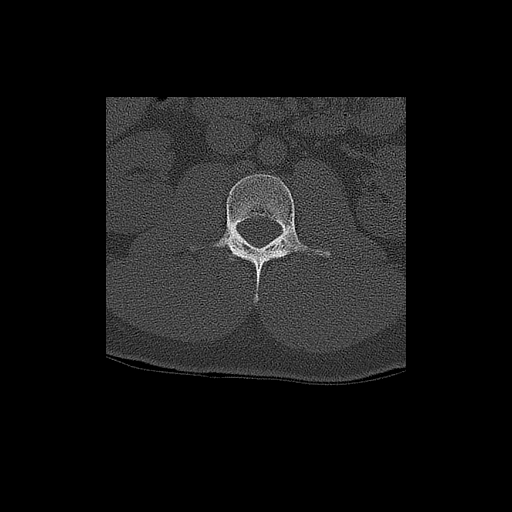
[im 114/133  bone]
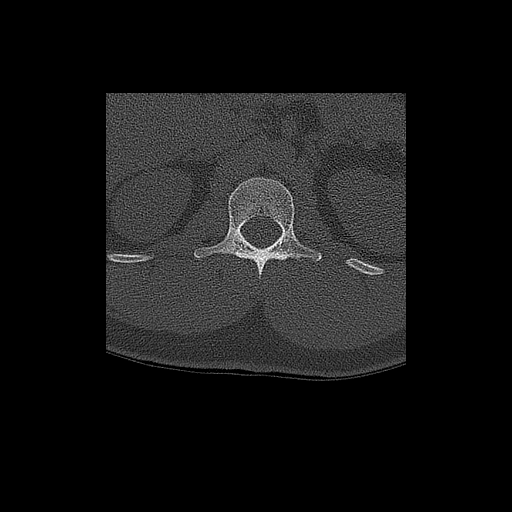

[18 of 33 positions shown; findings below may reference images not displayed]

FINDINGS: Segmentation: 5 lumbar type vertebrae.

Alignment: Normal.

Vertebrae: No acute fracture or focal pathologic process.

Paraspinal and other soft tissues: Negative.

Disc levels: No spinal canal or neural foraminal stenosis.
IMPRESSION: No acute fracture or static subluxation of the lumbar spine.

## 2022-05-28 ENCOUNTER — Encounter (HOSPITAL_COMMUNITY): Payer: Self-pay

## 2022-05-28 ENCOUNTER — Emergency Department (HOSPITAL_COMMUNITY)
Admission: EM | Admit: 2022-05-28 | Discharge: 2022-05-28 | Disposition: A | Discharge status: Home / Self Care | Payer: BC Managed Care – PPO | Attending: Emergency Medicine | Admitting: Emergency Medicine

## 2022-05-28 ENCOUNTER — Other Ambulatory Visit: Payer: Self-pay

## 2022-05-28 DIAGNOSIS — M62838 Other muscle spasm: Secondary | ICD-10-CM | POA: Diagnosis not present

## 2022-05-28 DIAGNOSIS — M62830 Muscle spasm of back: Secondary | ICD-10-CM | POA: Diagnosis not present

## 2022-05-28 DIAGNOSIS — M542 Cervicalgia: Secondary | ICD-10-CM | POA: Diagnosis not present

## 2022-05-28 HISTORY — DX: Unspecified osteoarthritis, unspecified site: M19.90

## 2022-05-28 MED ORDER — CYCLOBENZAPRINE HCL 10 MG PO TABS: 5.0000 mg | ORAL_TABLET | Freq: Every day | ORAL | 0 refills | Status: AC

## 2022-05-28 MED ORDER — KETOROLAC TROMETHAMINE 60 MG/2ML IM SOLN
30.0000 mg | Freq: Once | INTRAMUSCULAR | Status: AC
  Administered 2022-05-28: 30 mg via INTRAMUSCULAR
  Filled 2022-05-28: qty 2

## 2022-05-28 NOTE — Discharge Instructions (Signed)
You may use over-the-counter Naproxen (Aleve), Acetaminophen (Tylenol), topical muscle creams such as SalonPas, Icy Hot, Bengay, etc. Please stretch, apply ice or heat (whichever helps), and have massage therapy for additional assistance.  

## 2022-05-28 NOTE — ED Triage Notes (Signed)
Pt arrives c/o pain to R shoulder/neck x1 week. States that he thought he maybe slept on it wrong but the pain is progressively worsening. Took tylenol around 2200 last night without any relief. Denies injury.

## 2022-05-28 NOTE — ED Provider Notes (Signed)
[place] EMERGENCY DEPARTMENT AT [facility] Provider Note  CSN: [id number] Arrival date & time: 05/28/22 0155  Chief Complaint(s) Neck Pain  HPI Brandon Hickman is a 37 y.o. male here for right shoulder pain noted 1 week ago after waking up from sleep.  Patient believes that he slept on it wrong.  Pain has been fluctuating in intensity.  He has intermittently spasms.  Pain radiates up his neck and down his arm.  Patient has tried applying heat and having massages.  Patient also stretching.  Given the persistent symptoms, he presented for evaluation.  He denies any recent fevers or infections.  No cough or congestion.  No fall or trauma.  He does report lifting things above his head at work which are likely exacerbating his symptoms.  No extremity numbness or weakness.  The history is provided by the patient.    Past Medical History Past Medical History:  Diagnosis Date   Arthritis    R foot   Patient Active Problem List   Diagnosis Date Noted   Abdominal pain 11/21/2021   Nausea and vomiting 11/21/2021   SIRS (systemic inflammatory response syndrome) (HCC) 11/21/2021   Metabolic acidosis 11/21/2021   Home Medication(s) Prior to Admission medications   Medication Sig Start Date End Date Taking? Authorizing Provider  cyclobenzaprine (FLEXERIL) 10 MG tablet Take 0.5-1 tablets (5-10 mg total) by mouth at bedtime for 10 days. 05/28/22 06/07/22 Yes Sueko Dimichele, Amadeo Garnet, MD  acetaminophen (TYLENOL) 325 MG tablet Take 2 tablets (650 mg total) by mouth every 6 (six) hours as needed for mild pain (or Fever >/= 101). 11/22/21   Ghimire, Werner Lean, MD                                                                                                                                    Allergies Patient has no known allergies.  Review of Systems Review of Systems As noted in HPI  Physical Exam Vital Signs  I have reviewed the triage vital signs BP (!) 140/95   Pulse 64    Temp 97.8 F (36.6 C) (Oral)   Resp 16   Ht 6\' 2"  (1.88 m)   Wt 119.3 kg   SpO2 96%   BMI 33.77 kg/m   Physical Exam Vitals reviewed.  Constitutional:      General: He is not in acute distress.    Appearance: He is well-developed. He is not diaphoretic.  HENT:     Head: Normocephalic and atraumatic.     Right Ear: External ear normal.     Left Ear: External ear normal.     Nose: Nose normal.     Mouth/Throat:     Mouth: Mucous membranes are moist.  Eyes:     General: No scleral icterus.    Conjunctiva/sclera: Conjunctivae normal.  Neck:     Trachea: Phonation normal.  Cardiovascular:     Rate and  Rhythm: Normal rate and regular rhythm.  Pulmonary:     Effort: Pulmonary effort is normal. No respiratory distress.     Breath sounds: No stridor.  Abdominal:     General: There is no distension.  Musculoskeletal:        General: Normal range of motion.     Right wrist: Normal range of motion. Normal pulse.     Left wrist: Normal range of motion. Normal pulse.     Cervical back: Normal range of motion. Spasms and tenderness present.     Thoracic back: Spasms and tenderness present.       Back:  Neurological:     Mental Status: He is alert and oriented to person, place, and time.     Sensory: Sensation is intact.     Motor: Motor function is intact.  Psychiatric:        Behavior: Behavior normal.     ED Results and Treatments Labs (all labs ordered are listed, but only abnormal results are displayed) Labs Reviewed - No data to display                                                                                                                       EKG  EKG Interpretation  Date/Time:    Ventricular Rate:    PR Interval:    QRS Duration:   QT Interval:    QTC Calculation:   R Axis:     Text Interpretation:         Radiology No results found.  Medications Ordered in ED Medications  ketorolac (TORADOL) injection 30 mg (30 mg Intramuscular Given 05/28/22  0329)                                                                                                                                     Procedures Procedures  (including critical care time)  Medical Decision Making / ED Course  Click here for ABCD2, HEART and other calculators  Medical Decision Making Risk Prescription drug management.    This patient presents to the ED for: Right should pain   Key initial findings: NVI  Presentation involves an extensive number of treatment options, and is a complaint that carries with it a high risk of complications and morbidity. The differential diagnosis includes but not limited to:  Muscle strain/spasm. Low concern for acute arterial disease, PE, PNA, PTX, fracture/dislocation.  Hospitalization  considered:  no  Initial intervention:  IM toradol   ED Course:  No indication for imaging emergently. Patient was recommended to take short course of scheduled NSAIDs and engage in early mobility as definitive treatment. Return precautions discussed for worsening or new concerning symptoms.        Final Clinical Impression(s) / ED Diagnoses Final diagnoses:  Trapezius muscle spasm   The patient appears reasonably screened and/or stabilized for discharge and I doubt any other medical condition or other [facility] requiring further screening, evaluation, or treatment in the ED at this time. I have discussed the findings, Dx and Tx plan with the patient/family who expressed understanding and agree(s) with the plan. Discharge instructions discussed at length. The patient/family was given strict return precautions who verbalized understanding of the instructions. No further questions at time of discharge.  Disposition: Discharge  Condition: Good  ED Discharge Orders          Ordered    cyclobenzaprine (FLEXERIL) 10 MG tablet  Daily at bedtime        05/28/22 7846            Follow Up: Primary care provider  Call  to schedule an  appointment for close follow up           This chart was dictated using voice recognition software.  Despite best efforts to proofread,  errors can occur which can change the documentation meaning.    Nira Conn, MD 05/28/22 [phone]

## 2022-07-26 ENCOUNTER — Encounter: Payer: Self-pay | Admitting: Medical

## 2022-07-26 ENCOUNTER — Ambulatory Visit (INDEPENDENT_AMBULATORY_CARE_PROVIDER_SITE_OTHER): Payer: BC Managed Care – PPO | Admitting: Medical

## 2022-07-26 VITALS — BP 130/88 | HR 75 | Ht 74.0 in | Wt 259.0 lb

## 2022-07-26 DIAGNOSIS — Z113 Encounter for screening for infections with a predominantly sexual mode of transmission: Secondary | ICD-10-CM | POA: Diagnosis not present

## 2022-07-26 DIAGNOSIS — Z0000 Encounter for general adult medical examination without abnormal findings: Secondary | ICD-10-CM | POA: Diagnosis not present

## 2022-07-26 DIAGNOSIS — Z131 Encounter for screening for diabetes mellitus: Secondary | ICD-10-CM | POA: Insufficient documentation

## 2022-07-26 DIAGNOSIS — R030 Elevated blood-pressure reading, without diagnosis of hypertension: Secondary | ICD-10-CM | POA: Diagnosis not present

## 2022-07-26 DIAGNOSIS — R2241 Localized swelling, mass and lump, right lower limb: Secondary | ICD-10-CM

## 2022-07-26 DIAGNOSIS — G8929 Other chronic pain: Secondary | ICD-10-CM | POA: Insufficient documentation

## 2022-07-26 DIAGNOSIS — Z13220 Encounter for screening for lipoid disorders: Secondary | ICD-10-CM | POA: Insufficient documentation

## 2022-07-26 DIAGNOSIS — R5383 Other fatigue: Secondary | ICD-10-CM

## 2022-07-26 NOTE — Progress Notes (Signed)
Subjective:  Brandon Hickman is a 37 y.o. male who presents for Chief Complaint  Patient presents with   Medical Management of Chronic Issues    New pt, get established. Hasn't seen a pcp since high school. BP has been going up and down. Having some depression issues- PH-9 abnormal     Here as a new patient.  Mother is Brandon Hickman who comes here for care.  Concern: He notes concerns about blood pressure. He notes 2 incidents recently where BP was elevated.  At chiropractor recently had 3 different readings, some high, some low.  Few weeks later mom checked with wrist cuff and one was high reading, one low reading.  No prior diagnosis of HTN or medication for this.   Gets some muscle soreness in back and right foot. Gets pain in upper back, sometimes lower back, tension in upper back  Has seen foot specialist in the past years ago, but has a bump on his right foot long term.   Sometimes mood is down, sometimes gets stressed.  He feels it is a mixture of work stress, not the best sleep, parental responsibilities, and life stress in general  Reviewed their medical, surgical, family, social, medication, and allergy history and updated chart as appropriate.  No Known Allergies  Past Medical History:  Diagnosis Date   Arthritis    R foot   Chronic pain     No current outpatient medications on file prior to visit.   No current facility-administered medications on file prior to visit.     No current outpatient medications on file.  Family History  Problem Relation Age of Onset   Hypertension Father    Cancer Neg Hx    Heart disease Neg Hx     Past Surgical History:  Procedure Laterality Date   ARTHROSCOPIC REPAIR ACL Left    MENISCUS REPAIR Left    Review of Systems  Constitutional:  Positive for malaise/fatigue. Negative for chills, fever and weight loss.  HENT:  Negative for congestion, ear pain, hearing loss, sore throat and tinnitus.   Eyes:  Negative for blurred  vision, pain and redness.  Respiratory:  Negative for cough, hemoptysis and shortness of breath.   Cardiovascular:  Negative for chest pain, palpitations, orthopnea, claudication and leg swelling.  Gastrointestinal:  Negative for abdominal pain, blood in stool, constipation, diarrhea, nausea and vomiting.  Genitourinary:  Negative for dysuria, flank pain, frequency, hematuria and urgency.  Musculoskeletal:  Positive for back pain and neck pain. Negative for falls, joint pain and myalgias.  Skin:  Negative for itching and rash.  Neurological:  Negative for dizziness, tingling, speech change, seizures, weakness and headaches.  Endo/Heme/Allergies:  Negative for polydipsia. Does not bruise/bleed easily.  Psychiatric/Behavioral:  Negative for depression and memory loss. The patient has insomnia. The patient is not nervous/anxious.        Objective:  BP 130/88   Pulse 75   Ht 6\' 2"  (1.88 m)   Wt 259 lb (117.5 kg)   SpO2 98%   BMI 33.25 kg/m   General appearance: alert, no distress, WD/WN, African American male Skin: unremarkable HEENT: normocephalic, conjunctiva/corneas normal, sclerae anicteric, PERRLA, EOMi, nares patent, no discharge or erythema, pharynx normal Oral cavity: MMM, tongue normal Neck: supple, no lymphadenopathy, no thyromegaly, no masses, normal ROM, no bruits Chest: non tender, normal shape and expansion Heart: RRR, normal S1, S2, no murmurs Lungs: CTA bilaterally, no wheezes, rhonchi, or rales Abdomen: +bs, soft, non tender, non distended, no  masses, no hepatomegaly, no splenomegaly, no bruits Back: non tender, normal ROM, no scoliosis Musculoskeletal: Right dorsal lateral foot with a 2.5 to 3 cm area of fullness suggestive of lipoma, relatively flat feet in general, normal range of motion, no other deformity, otherwise upper extremities non tender, no obvious deformity, normal ROM throughout, lower extremities non tender, no obvious deformity, normal ROM  throughout Extremities: no edema, no cyanosis, no clubbing Pulses: 2+ symmetric, upper and lower extremities, normal cap refill Neurological: alert, oriented x 3, CN2-12 intact, strength normal upper extremities and lower extremities, sensation normal throughout, DTRs 2+ throughout, no cerebellar signs, gait normal Psychiatric: normal affect, behavior normal, pleasant  GU: normal male external genitalia,circumcised, nontender, no masses, no hernia, no lymphadenopathy Rectal: deferred    Assessment and Plan :   Encounter Diagnoses  Name Primary?   Encounter for health maintenance examination in adult Yes   Elevated blood pressure reading without diagnosis of hypertension    Other fatigue    Screen for STD (sexually transmitted disease)    Screening for diabetes mellitus    Screening for lipid disorders    Other chronic pain    Foot mass, right     This visit was a preventative care visit, also known as wellness visit or routine physical.   Topics typically include healthy lifestyle, diet, exercise, preventative care, vaccinations, sick and well care, proper use of emergency dept and after hours care, as well as other concerns.     Separate significant issues discussed: Elevated BP -readings today are slightly borderline.  We discussed diet and exercise recommendations, recommend he lose weight, recommend he limit salt.  Will continue to monitor blood pressure.  Fatigue -discussed possible causes.  Updated labs this week when he comes back fasting for labs.  Work on sleep hygiene and better sleep.  Chronic pain-musculoskeletal in nature.  Discussed getting stretching on a regular basis, regular exercise, work on good posture.  Foot mass -likely a lipoma.  Consider baseline x-ray.    General Recommendations: Continue to return yearly for your annual wellness and preventative care visits.  This gives [place] a chance to discuss healthy lifestyle, exercise, vaccinations, review your  chart record, and perform screenings where appropriate.  I recommend you see your eye doctor yearly for routine vision care.  I recommend you see your dentist yearly for routine dental care including hygiene visits twice yearly.   Vaccination   There is no immunization history on file for this patient.  Vaccine recommendations: Consider updated tetanus booster   Screening for cancer: Colon cancer screening: Age 49  Testicular cancer screening You should do a monthly self testicular exam if you are between 12-85 years old, and we typically do a testicular exam on the yearly physical for this same age group.   Prostate Cancer screening: The recommended prostate cancer screening test is a blood test called the prostate-specific antigen (PSA) test. PSA is a protein that is made in the prostate. As you age, your prostate naturally produces more PSA. Abnormally high PSA levels may be caused by: Prostate cancer. An enlarged prostate that is not caused by cancer (benign prostatic hyperplasia, or BPH). This condition is very common in older men. A prostate gland infection (prostatitis) or urinary tract infection. Certain medicines such as male hormones (like testosterone) or other medicines that raise testosterone levels. A rectal exam may be done as part of prostate cancer screening to help provide information about the size of your prostate gland. When a rectal  exam is performed, it should be done after the PSA level is drawn to avoid any effect on the results.   Skin cancer screening: Check your skin regularly for new changes, growing lesions, or other lesions of concern Come in for evaluation if you have skin lesions of concern.   Lung cancer screening: If you have a greater than 20 pack year history of tobacco use, then you may qualify for lung cancer screening with a chest CT scan.   Please call your insurance company to inquire about coverage for this test.   Pancreatic cancer:   no current screening test is available or routinely recommended. (risk factors: smoking, overweight or obese, diabetes, chronic pancreatitis, work exposure - dry cleaning, metal working, 35yo>, M>F, Tree surgeon, family hx/o, hereditary breast, ovarian, melanoma, lynch, peutz-jeghers).  Symptoms: jaundice, dark urine, light color or greasy stools, itchy skin, belly or back pain, weight loss, poor appetite, nausea, vomiting, liver enlargement, DVT/blood clots.   We currently don't have screenings for other cancers besides breast, cervical, colon, and lung cancers.  If you have a strong family history of cancer or have other cancer screening concerns, please let me know.  Genetic testing referral is an option for individuals with high cancer risk in the family.  There are some other cancer screenings in development currently.   Bone health: Get at least 150 minutes of aerobic exercise weekly Get weight bearing exercise at least once weekly Bone density test:  A bone density test is an imaging test that uses a type of X-ray to measure the amount of calcium and other minerals in your bones. The test may be used to diagnose or screen you for a condition that causes weak or thin bones (osteoporosis), predict your risk for a broken bone (fracture), or determine how well your osteoporosis treatment is working. The bone density test is recommended for females 65 and older, or females or males <65 if certain risk factors such as thyroid disease, long term use of steroids such as for asthma or rheumatological issues, vitamin D deficiency, estrogen deficiency, family history of osteoporosis, self or family history of fragility fracture in first degree relative.    Heart health: Get at least 150 minutes of aerobic exercise weekly Limit alcohol It is important to maintain a healthy blood pressure and healthy cholesterol numbers  Heart disease screening: Screening for heart disease includes screening for  blood pressure, fasting lipids, glucose/diabetes screening, BMI height to weight ratio, reviewed of smoking status, physical activity, and diet.    Goals include blood pressure 120/80 or less, maintaining a healthy lipid/cholesterol profile, preventing diabetes or keeping diabetes numbers under good control, not smoking or using tobacco products, exercising most days per week or at least 150 minutes per week of exercise, and eating healthy variety of fruits and vegetables, healthy oils, and avoiding unhealthy food choices like fried food, fast food, high sugar and high cholesterol foods.    Other tests may possibly include EKG test, CT coronary calcium score, echocardiogram, exercise treadmill stress test.      Vascular disease screening: For higher risk individuals including smokers, diabetics, patients with known heart disease or high blood pressure, kidney disease, and others, screening for vascular disease or atherosclerosis of the arteries is available.  Examples may include carotid ultrasound, abdominal aortic ultrasound, ABI blood flow screening in the legs, thoracic aorta screening.    Medical care options: I recommend you continue to seek care here first for routine care.  We try really  hard to have available appointments Monday through Friday daytime hours for sick visits, acute visits, and physicals.  Urgent care should be used for after hours and weekends for significant issues that cannot wait till the next day.  The emergency department should be used for significant potentially life-threatening emergencies.  The emergency department is expensive, can often have long wait times for less significant concerns, so try to utilize primary care, urgent care, or telemedicine when possible to avoid unnecessary trips to the emergency department.  Virtual visits and telemedicine have been introduced since the pandemic started in 2020, and can be convenient ways to receive medical care.  We offer  virtual appointments as well to assist you in a variety of options to seek medical care.   Legal  Take the time to do a last will and testament, Advanced Directives including Health Care Power of Attorney and Living Will documents.  Don't leave your family with burdens that can be handled ahead of time.   Advanced Directives: I recommend you consider completing a Health Care Power of Attorney and Living Will.   These documents respect your wishes and help alleviate burdens on your loved ones if you were to become terminally ill or be in a position to need those documents enforced.    You can complete Advanced Directives yourself, have them notarized, then have copies made for our office, for you and for anybody you feel should have them in safe keeping.  Or, you can have an attorney prepare these documents.   If you haven't updated your Last Will and Testament in a while, it may be worthwhile having an attorney prepare these documents together and save on some costs.       Spiritual and Emotional Health Keeping a healthy spiritual life can help you better manage your physical health. Your spiritual life can help you to cope with any issues that may arise with your physical health.  Balance can keep [place] healthy and help [place] to recover.  If you are struggling with your spiritual health there are questions that you may want to ask yourself:  What makes me feel most complete? When do I feel most connected to the rest of the world? Where do I find the most inner strength? What am I doing when I feel whole?  Helpful tips: Being in nature. Some people feel very connected and at peace when they are walking outdoors or are outside. Helping others. Some feel the largest sense of wellbeing when they are of service to others. Being of service can take on many forms. It can be doing volunteer work, being kind to strangers, or offering a hand to a friend in need. Gratitude. Some people find they feel the  most connected when they remain grateful. They may make lists of all the things they are grateful for or say a thank you out loud for all they have.    Emotional Health Are you in tune with your emotional health?  Check out this link: http://www.marquez-love.com/    Financial Health Make sure you use a budget for your personal finances Make sure you are insured against risks (health insurance, life insurance, auto insurance, etc) Save more, spend less Set financial goals If you need help in this area, good resources include counseling through [employer] or other community resources, have a meeting with a Social research officer, government, and a good resource is the [employer]    Mycah was seen today for medical management of  chronic issues.  Diagnoses and all orders for this visit:  Encounter for health maintenance examination in adult -     Comprehensive metabolic panel; Future -     CBC with Differential/Platelet; Future -     Lipid panel; Future -     Hemoglobin A1c; Future -     TSH; Future -     Testosterone; Future -     RPR+HIV+GC+CT Panel; Future -     Hepatitis C antibody; Future -     Hepatitis B surface antigen; Future -     POCT Urinalysis DIP (Proadvantage Device); Future  Elevated blood pressure reading without diagnosis of hypertension  Other fatigue -     TSH; Future -     Testosterone; Future  Screen for STD (sexually transmitted disease) -     RPR+HIV+GC+CT Panel; Future -     Hepatitis C antibody; Future -     Hepatitis B surface antigen; Future  Screening for diabetes mellitus -     Hemoglobin A1c; Future  Screening for lipid disorders -     Lipid panel; Future  Other chronic pain  Foot mass, right    Follow-up pending labs, yearly for physical

## 2022-07-26 NOTE — Patient Instructions (Signed)
This visit was a preventative care visit, also known as wellness visit or routine physical.   Topics typically include healthy lifestyle, diet, exercise, preventative care, vaccinations, sick and well care, proper use of emergency dept and after hours care, as well as other concerns.     Separate significant issues discussed: Elevated BP -readings today are slightly borderline.  We discussed diet and exercise recommendations, recommend he lose weight, recommend he limit salt.  Will continue to monitor blood pressure.  Fatigue -discussed possible causes.  Updated labs this week when he comes back fasting for labs.  Work on sleep hygiene and better sleep.  Chronic pain-musculoskeletal in nature.  Discussed getting stretching on a regular basis, regular exercise, work on good posture.  Foot mass -likely a lipoma.  Consider baseline x-ray.    General Recommendations: Continue to return yearly for your annual wellness and preventative care visits.  This gives [place] a chance to discuss healthy lifestyle, exercise, vaccinations, review your chart record, and perform screenings where appropriate.  I recommend you see your eye doctor yearly for routine vision care.  I recommend you see your dentist yearly for routine dental care including hygiene visits twice yearly.   Vaccination   There is no immunization history on file for this patient.  Vaccine recommendations: Consider updated tetanus booster   Screening for cancer: Colon cancer screening: Age 87  Testicular cancer screening You should do a monthly self testicular exam if you are between 95-81 years old, and we typically do a testicular exam on the yearly physical for this same age group.   Prostate Cancer screening: The recommended prostate cancer screening test is a blood test called the prostate-specific antigen (PSA) test. PSA is a protein that is made in the prostate. As you age, your prostate naturally produces more PSA.  Abnormally high PSA levels may be caused by: Prostate cancer. An enlarged prostate that is not caused by cancer (benign prostatic hyperplasia, or BPH). This condition is very common in older men. A prostate gland infection (prostatitis) or urinary tract infection. Certain medicines such as male hormones (like testosterone) or other medicines that raise testosterone levels. A rectal exam may be done as part of prostate cancer screening to help provide information about the size of your prostate gland. When a rectal exam is performed, it should be done after the PSA level is drawn to avoid any effect on the results.   Skin cancer screening: Check your skin regularly for new changes, growing lesions, or other lesions of concern Come in for evaluation if you have skin lesions of concern.   Lung cancer screening: If you have a greater than 20 pack year history of tobacco use, then you may qualify for lung cancer screening with a chest CT scan.   Please call your insurance company to inquire about coverage for this test.   Pancreatic cancer:  no current screening test is available or routinely recommended. (risk factors: smoking, overweight or obese, diabetes, chronic pancreatitis, work exposure - dry cleaning, metal working, 35yo>, M>F, Tree surgeon, family hx/o, hereditary breast, ovarian, melanoma, lynch, peutz-jeghers).  Symptoms: jaundice, dark urine, light color or greasy stools, itchy skin, belly or back pain, weight loss, poor appetite, nausea, vomiting, liver enlargement, DVT/blood clots.   We currently don't have screenings for other cancers besides breast, cervical, colon, and lung cancers.  If you have a strong family history of cancer or have other cancer screening concerns, please let me know.  Genetic testing referral is an  option for individuals with high cancer risk in the family.  There are some other cancer screenings in development currently.   Bone health: Get at least 150  minutes of aerobic exercise weekly Get weight bearing exercise at least once weekly Bone density test:  A bone density test is an imaging test that uses a type of X-ray to measure the amount of calcium and other minerals in your bones. The test may be used to diagnose or screen you for a condition that causes weak or thin bones (osteoporosis), predict your risk for a broken bone (fracture), or determine how well your osteoporosis treatment is working. The bone density test is recommended for females 65 and older, or females or males <65 if certain risk factors such as thyroid disease, long term use of steroids such as for asthma or rheumatological issues, vitamin D deficiency, estrogen deficiency, family history of osteoporosis, self or family history of fragility fracture in first degree relative.    Heart health: Get at least 150 minutes of aerobic exercise weekly Limit alcohol It is important to maintain a healthy blood pressure and healthy cholesterol numbers  Heart disease screening: Screening for heart disease includes screening for blood pressure, fasting lipids, glucose/diabetes screening, BMI height to weight ratio, reviewed of smoking status, physical activity, and diet.    Goals include blood pressure 120/80 or less, maintaining a healthy lipid/cholesterol profile, preventing diabetes or keeping diabetes numbers under good control, not smoking or using tobacco products, exercising most days per week or at least 150 minutes per week of exercise, and eating healthy variety of fruits and vegetables, healthy oils, and avoiding unhealthy food choices like fried food, fast food, high sugar and high cholesterol foods.    Other tests may possibly include EKG test, CT coronary calcium score, echocardiogram, exercise treadmill stress test.      Vascular disease screening: For higher risk individuals including smokers, diabetics, patients with known heart disease or high blood pressure, kidney  disease, and others, screening for vascular disease or atherosclerosis of the arteries is available.  Examples may include carotid ultrasound, abdominal aortic ultrasound, ABI blood flow screening in the legs, thoracic aorta screening.    Medical care options: I recommend you continue to seek care here first for routine care.  We try really hard to have available appointments Monday through Friday daytime hours for sick visits, acute visits, and physicals.  Urgent care should be used for after hours and weekends for significant issues that cannot wait till the next day.  The emergency department should be used for significant potentially life-threatening emergencies.  The emergency department is expensive, can often have long wait times for less significant concerns, so try to utilize primary care, urgent care, or telemedicine when possible to avoid unnecessary trips to the emergency department.  Virtual visits and telemedicine have been introduced since the pandemic started in 2020, and can be convenient ways to receive medical care.  We offer virtual appointments as well to assist you in a variety of options to seek medical care.   Legal  Take the time to do a last will and testament, Advanced Directives including Health Care Power of Attorney and Living Will documents.  Don't leave your family with burdens that can be handled ahead of time.   Advanced Directives: I recommend you consider completing a Health Care Power of Attorney and Living Will.   These documents respect your wishes and help alleviate burdens on your loved ones if you were to become  terminally ill or be in a position to need those documents enforced.    You can complete Advanced Directives yourself, have them notarized, then have copies made for our office, for you and for anybody you feel should have them in safe keeping.  Or, you can have an attorney prepare these documents.   If you haven't updated your Last Will and  Testament in a while, it may be worthwhile having an attorney prepare these documents together and save on some costs.       Spiritual and Emotional Health Keeping a healthy spiritual life can help you better manage your physical health. Your spiritual life can help you to cope with any issues that may arise with your physical health.  Balance can keep [place] healthy and help [place] to recover.  If you are struggling with your spiritual health there are questions that you may want to ask yourself:  What makes me feel most complete? When do I feel most connected to the rest of the world? Where do I find the most inner strength? What am I doing when I feel whole?  Helpful tips: Being in nature. Some people feel very connected and at peace when they are walking outdoors or are outside. Helping others. Some feel the largest sense of wellbeing when they are of service to others. Being of service can take on many forms. It can be doing volunteer work, being kind to strangers, or offering a hand to a friend in need. Gratitude. Some people find they feel the most connected when they remain grateful. They may make lists of all the things they are grateful for or say a thank you out loud for all they have.    Emotional Health Are you in tune with your emotional health?  Check out this link: http://www.marquez-love.com/    Financial Health Make sure you use a budget for your personal finances Make sure you are insured against risks (health insurance, life insurance, auto insurance, etc) Save more, spend less Set financial goals If you need help in this area, good resources include counseling through [employer] or other community resources, have a meeting with a Social research officer, government, and a good resource is the [employer]

## 2022-07-28 ENCOUNTER — Other Ambulatory Visit: Payer: BC Managed Care – PPO

## 2022-07-28 DIAGNOSIS — R5383 Other fatigue: Secondary | ICD-10-CM | POA: Diagnosis not present

## 2022-07-28 DIAGNOSIS — Z131 Encounter for screening for diabetes mellitus: Secondary | ICD-10-CM

## 2022-07-28 DIAGNOSIS — Z0000 Encounter for general adult medical examination without abnormal findings: Secondary | ICD-10-CM

## 2022-07-28 DIAGNOSIS — Z113 Encounter for screening for infections with a predominantly sexual mode of transmission: Secondary | ICD-10-CM

## 2022-07-28 DIAGNOSIS — Z13220 Encounter for screening for lipoid disorders: Secondary | ICD-10-CM

## 2022-07-29 LAB — COMPREHENSIVE METABOLIC PANEL
ALT: 40 IU/L (ref 0–44)
AST: 17 IU/L (ref 0–40)
BUN/Creatinine Ratio: 11 (ref 9–20)
Bilirubin Total: 1.3 mg/dL — ABNORMAL HIGH (ref 0.0–1.2)
Chloride: 103 mmol/L (ref 96–106)
Potassium: 4.7 mmol/L (ref 3.5–5.2)
Sodium: 142 mmol/L (ref 134–144)
Total Protein: 7.2 g/dL (ref 6.0–8.5)

## 2022-07-29 LAB — CBC WITH DIFFERENTIAL/PLATELET
Basophils Absolute: 0 10*3/uL (ref 0.0–0.2)
Basos: 0 %
Eos: 1 %
Hematocrit: 49.8 % (ref 37.5–51.0)
Immature Grans (Abs): 0 10*3/uL (ref 0.0–0.1)
Immature Granulocytes: 0 %
Lymphs: 36 %
MCH: 28.2 pg (ref 26.6–33.0)
Neutrophils Absolute: 6.8 10*3/uL (ref 1.4–7.0)
Neutrophils: 58 %
RBC: 5.82 x10E6/uL — ABNORMAL HIGH (ref 4.14–5.80)

## 2022-07-29 LAB — TESTOSTERONE: Testosterone: 186 ng/dL — ABNORMAL LOW (ref 264–916)

## 2022-07-29 LAB — RPR+HIV+GC+CT PANEL

## 2022-07-29 LAB — HEMOGLOBIN A1C: Est. average glucose Bld gHb Est-mCnc: 126 mg/dL

## 2022-07-29 LAB — LIPID PANEL: Chol/HDL Ratio: 3.6 ratio (ref 0.0–5.0)

## 2022-07-29 LAB — HEPATITIS C ANTIBODY: Hep C Virus Ab: NONREACTIVE

## 2022-07-29 NOTE — Progress Notes (Signed)
Results sent through MyChart

## 2022-07-31 LAB — CBC WITH DIFFERENTIAL/PLATELET
EOS (ABSOLUTE): 0.2 10*3/uL (ref 0.0–0.4)
Hemoglobin: 16.4 g/dL (ref 13.0–17.7)
Lymphocytes Absolute: 4.3 10*3/uL — ABNORMAL HIGH (ref 0.7–3.1)
MCHC: 32.9 g/dL (ref 31.5–35.7)
MCV: 86 fL (ref 79–97)
Monocytes Absolute: 0.5 10*3/uL (ref 0.1–0.9)
Monocytes: 5 %
RDW: 13.6 % (ref 11.6–15.4)
WBC: 12 10*3/uL — ABNORMAL HIGH (ref 3.4–10.8)

## 2022-07-31 LAB — COMPREHENSIVE METABOLIC PANEL
Albumin: 4.6 g/dL (ref 4.1–5.1)
Alkaline Phosphatase: 68 IU/L (ref 44–121)
BUN: 12 mg/dL (ref 6–20)
CO2: 24 mmol/L (ref 20–29)
Calcium: 9.8 mg/dL (ref 8.7–10.2)
Creatinine, Ser: 1.13 mg/dL (ref 0.76–1.27)
Globulin, Total: 2.6 g/dL (ref 1.5–4.5)
Glucose: 112 mg/dL — ABNORMAL HIGH (ref 70–99)
eGFR: 87 mL/min/{1.73_m2} (ref >59)

## 2022-07-31 LAB — HEMOGLOBIN A1C: Hgb A1c MFr Bld: 6 % — ABNORMAL HIGH (ref 4.8–5.6)

## 2022-07-31 LAB — LIPID PANEL
Cholesterol, Total: 148 mg/dL (ref 100–199)
HDL: 41 mg/dL (ref >39)
LDL Chol Calc (NIH): 75 mg/dL (ref 0–99)
Triglycerides: 187 mg/dL — ABNORMAL HIGH (ref 0–149)
VLDL Cholesterol Cal: 32 mg/dL (ref 5–40)

## 2022-07-31 LAB — TSH: TSH: 1.7 u[IU]/mL (ref 0.450–4.500)

## 2022-07-31 LAB — HEPATITIS B SURFACE ANTIGEN: Hepatitis B Surface Ag: NEGATIVE

## 2022-07-31 LAB — RPR+HIV+GC+CT PANEL
Chlamydia trachomatis, NAA: NEGATIVE
HIV Screen 4th Generation wRfx: NONREACTIVE
RPR Ser Ql: NONREACTIVE

## 2022-08-01 NOTE — Progress Notes (Signed)
Results sent through MyChart

## 2022-08-25 ENCOUNTER — Ambulatory Visit (INDEPENDENT_AMBULATORY_CARE_PROVIDER_SITE_OTHER): Payer: BC Managed Care – PPO | Admitting: Medical

## 2022-08-25 VITALS — BP 120/82 | HR 65 | Wt 257.0 lb

## 2022-08-25 DIAGNOSIS — R7301 Impaired fasting glucose: Secondary | ICD-10-CM

## 2022-08-25 DIAGNOSIS — R5383 Other fatigue: Secondary | ICD-10-CM | POA: Diagnosis not present

## 2022-08-25 DIAGNOSIS — D72829 Elevated white blood cell count, unspecified: Secondary | ICD-10-CM | POA: Diagnosis not present

## 2022-08-25 DIAGNOSIS — R7989 Other specified abnormal findings of blood chemistry: Secondary | ICD-10-CM

## 2022-08-25 NOTE — Progress Notes (Signed)
Subjective:  Brandon Hickman is a 37 y.o. male who presents for Chief Complaint  Patient presents with   Medical Management of Chronic Issues    Discuss testosterone and white blood cell labs     Here today to discuss labs from recent physical.  Has recent physical he had elevated blood sugars, elevated bilirubin without other liver test elevated, testosterone was low, white blood cells were slightly elevated.  He has no new complaints today.  No other aggravating or relieving factors.    No other c/o.  Past Medical History:  Diagnosis Date   Arthritis    R foot   Chronic pain    No current outpatient medications on file prior to visit.   No current facility-administered medications on file prior to visit.     The following portions of the patient's history were reviewed and updated as appropriate: allergies, current medications, past family history, past medical history, past social history, past surgical history and problem list.  ROS Otherwise as in subjective above  Objective: BP 120/82   Pulse 65   Wt 257 lb (116.6 kg)   BMI 33.00 kg/m   General appearance: alert, no distress, well developed, well nourished   Assessment: Encounter Diagnoses  Name Primary?   Low testosterone Yes   Impaired fasting blood sugar    Fatigue, unspecified type    Leukocytosis, unspecified type      Plan: We discussed lab results from his recent physical.  Impaired glucose-we discussed the risk of diabetes.  Counseled on diet and exercise and trying to reduce the progression to diabetes.  Fatigue-we discussed possible causes.  He was low on testosterone last visit.  Additional labs to evaluate this today.  Low testosterone-we discussed the lab findings.  We discussed additional labs that are needed to help further evaluate and then come up with a plan for treatment.  Additional labs as below today.  Briefly discussed medication options for low testosterone, risk and benefits  of therapy.  Leukocytosis-unclear etiology.  Improved from last labs but still not clear of the cause.  He has no signs or symptoms of infection or B symptoms today.  He smokes marijuana but not cigarettes.  We will recheck CBC again maybe in the next month or 2.  If CBC still shows elevated white cells, consider other evaluation including chest x-ray, sed rate marker for inflammation, CRP, possible referral to hematology.  Dontrelle was seen today for medical management of chronic issues.  Diagnoses and all orders for this visit:  Low testosterone -     Testosterone -     Prolactin -     FSH/LH -     PSA  Impaired fasting blood sugar  Fatigue, unspecified type  Leukocytosis, unspecified type    Follow up: pending labs

## 2022-08-26 LAB — PROLACTIN: Prolactin: 6.5 ng/mL (ref 3.9–22.7)

## 2022-08-26 LAB — FSH/LH
FSH: 2 m[IU]/mL (ref 1.5–12.4)
LH: 6 m[IU]/mL (ref 1.7–8.6)

## 2022-08-26 LAB — PSA: Prostate Specific Ag, Serum: 0.2 ng/mL (ref 0.0–4.0)

## 2022-08-26 LAB — TESTOSTERONE: Testosterone: 148 ng/dL — ABNORMAL LOW (ref 264–916)

## 2022-08-26 NOTE — Progress Notes (Signed)
Results sent through MyChart

## 2023-07-27 ENCOUNTER — Other Ambulatory Visit: Payer: BC Managed Care – PPO

## 2024-01-03 ENCOUNTER — Ambulatory Visit: Admission: EM | Admit: 2024-01-03 | Discharge: 2024-01-03 | Disposition: A | Discharge status: Home / Self Care

## 2024-01-03 ENCOUNTER — Encounter: Payer: Self-pay | Admitting: Emergency Medicine

## 2024-01-03 DIAGNOSIS — K529 Noninfective gastroenteritis and colitis, unspecified: Secondary | ICD-10-CM

## 2024-01-03 DIAGNOSIS — R112 Nausea with vomiting, unspecified: Secondary | ICD-10-CM

## 2024-01-03 MED ORDER — SODIUM CHLORIDE 0.9 % IV SOLN: Freq: Once | INTRAVENOUS | Status: AC

## 2024-01-03 MED ORDER — METOCLOPRAMIDE HCL 5 MG/ML IJ SOLN
10.0000 mg | Freq: Once | INTRAMUSCULAR | Status: AC
  Administered 2024-01-03: 10 mg via INTRAMUSCULAR

## 2024-01-03 MED ORDER — SODIUM CHLORIDE 0.9 % IV SOLN: Freq: Once | INTRAVENOUS | Status: DC

## 2024-01-03 MED ORDER — ONDANSETRON 8 MG PO TBDP
8.0000 mg | ORAL_TABLET | Freq: Once | ORAL | Status: AC
  Administered 2024-01-03: 8 mg via ORAL

## 2024-01-03 MED ORDER — KETOROLAC TROMETHAMINE 15 MG/ML IJ SOLN
15.0000 mg | Freq: Once | INTRAMUSCULAR | Status: AC
  Administered 2024-01-03: 15 mg via INTRAMUSCULAR

## 2024-01-03 MED ORDER — PROMETHAZINE HCL 25 MG PO TABS: 25.0000 mg | ORAL_TABLET | Freq: Four times a day (QID) | ORAL | 0 refills | Status: AC | PRN

## 2024-01-03 NOTE — ED Triage Notes (Signed)
 Seen by provider prior to this nurse

## 2024-01-03 NOTE — ED Notes (Signed)
 Ambulated to bathroom

## 2024-01-03 NOTE — ED Provider Notes (Signed)
 " [name]    CSN: [id number] Arrival date & time: 01/03/24  0802      History   Chief Complaint No chief complaint on file.   HPI Brandon Hickman is a 38 y.o. male.   Pt with a hx of CHS, presents today due to nausea and vomiting that started yesterday around 9pm. Pt states that he believes his symptoms are due to eating peppers, chicken, and rice that he left out at work. Pt states that he has experienced 6-7 episodes of vomiting since last night. Pt denies fever, chills, throat pain, or nasal congestion. Pt denies known sick contacts or use of anything for symptoms. Pt does report recreational use of marijuana as well. Pt states that he smokes 3-4 blunts daily. Pt states that his symptoms feel like previous episodes of food poisoning.   The history is provided by the patient.    Past Medical History:  Diagnosis Date   Arthritis    R foot   Chronic pain     Patient Active Problem List   Diagnosis Date Noted   Encounter for health maintenance examination in adult 07/26/2022   Elevated blood pressure reading without diagnosis of hypertension 07/26/2022   Other fatigue 07/26/2022   Screen for STD (sexually transmitted disease) 07/26/2022   Screening for diabetes mellitus 07/26/2022   Screening for lipid disorders 07/26/2022   Foot mass, right 07/26/2022   Other chronic pain 07/26/2022   Abdominal pain 11/21/2021   Nausea and vomiting 11/21/2021   SIRS (systemic inflammatory response syndrome) (HCC) 11/21/2021   Metabolic acidosis 11/21/2021    Past Surgical History:  Procedure Laterality Date   ARTHROSCOPIC REPAIR ACL Left    MENISCUS REPAIR Left        Home Medications    Prior to Admission medications  Medication Sig Start Date End Date Taking? Authorizing Provider  promethazine  (PHENERGAN ) 25 MG tablet Take 1 tablet (25 mg total) by mouth every 6 (six) hours as needed for nausea or vomiting. 01/03/24  Yes Andra Corean BROCKS, PA-C     Family History Family History  Problem Relation Age of Onset   Hypertension Father    Cancer Neg Hx    Heart disease Neg Hx     Social History Social History[1]   Allergies   Patient has no known allergies.   Review of Systems Review of Systems   Physical Exam Triage Vital Signs ED Triage Vitals [01/03/24 0814]  Encounter Vitals Group     BP (!) 167/98     Girls Systolic BP Percentile      Girls Diastolic BP Percentile      Boys Systolic BP Percentile      Boys Diastolic BP Percentile      Pulse Rate 66     Resp (!) 22     Temp (!) 97.3 F (36.3 C)     Temp src      SpO2 99 %     Weight      Height      Head Circumference      Peak Flow      Pain Score      Pain Loc      Pain Education      Exclude from Growth Chart    No data found.  Updated Vital Signs BP (!) 170/78 (BP Location: Left Arm)   Pulse 71   Temp 98.1 F (36.7 C) (Oral)   Resp (!) 22   SpO2 98%  Visual Acuity Right Eye Distance:   Left Eye Distance:   Bilateral Distance:    Right Eye Near:   Left Eye Near:    Bilateral Near:     Physical Exam Vitals and nursing note reviewed.  Constitutional:      General: He is not in acute distress.    Appearance: Normal appearance. He is ill-appearing. He is not toxic-appearing or diaphoretic.  Eyes:     General: No scleral icterus. Cardiovascular:     Rate and Rhythm: Normal rate and regular rhythm.     Heart sounds: Normal heart sounds.  Pulmonary:     Effort: Pulmonary effort is normal. No respiratory distress.     Breath sounds: Normal breath sounds. No wheezing or rhonchi.  Abdominal:     General: Abdomen is flat. Bowel sounds are normal.     Palpations: Abdomen is soft.     Tenderness: There is generalized abdominal tenderness.  Skin:    General: Skin is warm.  Neurological:     Mental Status: He is alert and oriented to person, place, and time.  Psychiatric:        Mood and Affect: Mood normal.        Behavior:  Behavior normal.      UC Treatments / Results  Labs (all labs ordered are listed, but only abnormal results are displayed) Labs Reviewed - No data to display   EKG   Radiology No results found.  Procedures Procedures (including critical care time)  Medications Ordered in UC Medications  ondansetron  (ZOFRAN -ODT) disintegrating tablet 8 mg (8 mg Oral Given 01/03/24 0800)  ketorolac  (TORADOL ) 15 MG/ML injection 15 mg (15 mg Intramuscular Given 01/03/24 0841)  0.9 %  sodium chloride  infusion ( Intravenous New Bag/Given 01/03/24 0905)  metoCLOPramide  (REGLAN ) injection 10 mg (10 mg Intramuscular Given 01/03/24 1022)    Initial Impression / Assessment and Plan / UC Course  I have reviewed the triage vital signs and the nursing notes.  Pertinent labs & imaging results that were available during my care of the patient were reviewed by me and considered in my medical decision making (see chart for details).  Pt looked better before leaving but does not admit to feeling better. Pt advised that if symptoms do not improve within the next 24 hrs or so he needs to go to the ED.  Final Clinical Impressions(s) / UC Diagnoses   Final diagnoses:  Nausea and vomiting, unspecified vomiting type  Acute gastroenteritis     Discharge Instructions      You have been diagnosed with a gastrointestinal issue today with symptoms of nausea, vomiting, and/or diarrhea.  It is best that you eat a bland diet which includes dry toast, applesauce, bananas, chicken broth, plain rice, or plain mashed potatoes eat these things with no salt-and-pepper, excessive butter, or other seasonings.  It is also important to drink plenty of fluids as you are losing a lot of electrolytes due to vomiting and having diarrhea.  Diluted Gatorade, water, Pedialyte, liquid IV, etc. are good choices for fluid intake.  You may also use popsicles and/or Italian ice.  It is recommended that you do not use anything to stop your  diarrhea as it is your body's way of getting rid of the offending bug.  If your symptoms are not improving within 3 to 5 days, you are experiencing significant fatigue, or you are urinating less frequently you will need to follow-up with your PCP or report to the ER.  ED Prescriptions     Medication Sig Dispense Auth. Provider   promethazine  (PHENERGAN ) 25 MG tablet Take 1 tablet (25 mg total) by mouth every 6 (six) hours as needed for nausea or vomiting. 30 tablet Andra Corean BROCKS, PA-C      PDMP not reviewed this encounter.    [1]  Social History Tobacco Use   Smoking status: Never   Smokeless tobacco: Never  Vaping Use   Vaping status: Never Used  Substance Use Topics   Alcohol use: Never   Drug use: Yes    Types: Marijuana     Andra Corean BROCKS, PA-C 01/03/24 1047  "

## 2024-01-03 NOTE — Discharge Instructions (Addendum)
